# Patient Record
Sex: Female | Born: 1955
Health system: Southern US, Community
[De-identification: ages and names within clinical notes are randomized; demographics above are authoritative.]

## PROBLEM LIST (undated history)

## (undated) DIAGNOSIS — M1990 Unspecified osteoarthritis, unspecified site: Secondary | ICD-10-CM

## (undated) DIAGNOSIS — R51 Headache: Secondary | ICD-10-CM

## (undated) DIAGNOSIS — I10 Essential (primary) hypertension: Secondary | ICD-10-CM

## (undated) HISTORY — PX: ROTATOR CUFF REPAIR: SHX139

## (undated) HISTORY — DX: Essential (primary) hypertension: I10

## (undated) HISTORY — DX: Headache: R51

## (undated) HISTORY — DX: Unspecified osteoarthritis, unspecified site: M19.90

---

## 1995-03-30 HISTORY — PX: APPENDECTOMY: SHX54

## 2006-03-29 LAB — HM PAP SMEAR: HM Pap smear: NORMAL

## 2006-03-29 LAB — HM MAMMOGRAPHY: HM Mammogram: NORMAL

## 2010-07-19 ENCOUNTER — Emergency Department (INDEPENDENT_AMBULATORY_CARE_PROVIDER_SITE_OTHER): Payer: BC Managed Care – PPO

## 2010-07-19 ENCOUNTER — Emergency Department (HOSPITAL_BASED_OUTPATIENT_CLINIC_OR_DEPARTMENT_OTHER)
Admission: EM | Admit: 2010-07-19 | Discharge: 2010-07-19 | Disposition: A | Discharge status: Home / Self Care | Payer: BC Managed Care – PPO | Attending: Emergency Medicine | Admitting: Emergency Medicine

## 2010-07-19 DIAGNOSIS — R1110 Vomiting, unspecified: Secondary | ICD-10-CM

## 2010-07-19 DIAGNOSIS — R599 Enlarged lymph nodes, unspecified: Secondary | ICD-10-CM | POA: Insufficient documentation

## 2010-07-19 DIAGNOSIS — R109 Unspecified abdominal pain: Secondary | ICD-10-CM | POA: Insufficient documentation

## 2010-07-19 DIAGNOSIS — K8020 Calculus of gallbladder without cholecystitis without obstruction: Secondary | ICD-10-CM | POA: Insufficient documentation

## 2010-07-19 LAB — COMPREHENSIVE METABOLIC PANEL
AST: 48 U/L — ABNORMAL HIGH (ref 0–37)
Albumin: 4 g/dL (ref 3.5–5.2)
BUN: 13 mg/dL (ref 6–23)
Calcium: 9.4 mg/dL (ref 8.4–10.5)
Chloride: 103 mEq/L (ref 96–112)
Creatinine, Ser: 0.8 mg/dL (ref 0.4–1.2)
GFR calc Af Amer: >60 mL/min (ref >60)
Total Bilirubin: 0.4 mg/dL (ref 0.3–1.2)
Total Protein: 7.9 g/dL (ref 6.0–8.3)

## 2010-07-19 LAB — DIFFERENTIAL
Basophils Relative: 0 % (ref 0–1)
Eosinophils Absolute: 0.1 10*3/uL (ref 0.0–0.7)
Eosinophils Relative: 1 % (ref 0–5)
Lymphs Abs: 1.1 10*3/uL (ref 0.7–4.0)
Monocytes Relative: 10 % (ref 3–12)
Neutrophils Relative %: 72 % (ref 43–77)

## 2010-07-19 LAB — CBC
MCH: 27.8 pg (ref 26.0–34.0)
MCHC: 34.6 g/dL (ref 30.0–36.0)
MCV: 80.3 fL (ref 78.0–100.0)
Platelets: 266 10*3/uL (ref 150–400)
RBC: 4.57 MIL/uL (ref 3.87–5.11)
RDW: 14.3 % (ref 11.5–15.5)

## 2010-07-19 LAB — URINALYSIS, ROUTINE W REFLEX MICROSCOPIC
Glucose, UA: NEGATIVE mg/dL
Ketones, ur: NEGATIVE mg/dL
Nitrite: NEGATIVE
Protein, ur: NEGATIVE mg/dL
pH: 6 (ref 5.0–8.0)

## 2010-07-19 LAB — URINE MICROSCOPIC-ADD ON

## 2010-07-19 MED ORDER — IOHEXOL 300 MG/ML  SOLN
100.0000 mL | Freq: Once | INTRAMUSCULAR | Status: AC | PRN
  Administered 2010-07-19: 100 mL via INTRAVENOUS

## 2010-07-20 ENCOUNTER — Encounter: Payer: Self-pay | Admitting: Family

## 2010-07-20 ENCOUNTER — Ambulatory Visit (INDEPENDENT_AMBULATORY_CARE_PROVIDER_SITE_OTHER): Payer: BC Managed Care – PPO | Admitting: Family

## 2010-07-20 DIAGNOSIS — R590 Localized enlarged lymph nodes: Secondary | ICD-10-CM | POA: Insufficient documentation

## 2010-07-20 DIAGNOSIS — R599 Enlarged lymph nodes, unspecified: Secondary | ICD-10-CM

## 2010-07-20 DIAGNOSIS — R1013 Epigastric pain: Secondary | ICD-10-CM

## 2010-07-20 NOTE — Assessment & Plan Note (Addendum)
55 yr old female with abdominal pain.  CT abdomen and pelvis notes pericholecystic fluid with gallstone- ? Cholecystitis- will refer to surgery for further evaluation. Pt was instructed to call if worsening abdominal pain, nausea, vomitting.  She verbalizes understanding.

## 2010-07-20 NOTE — Assessment & Plan Note (Signed)
Incidental finding on CT abdomen/pelvis.  Will refer to Dr. Arlan Organ for further evaluation.  Discussed this finding with Patient and her daughter- discussed possible etiologies for this finding including lymphoma or infection.  Case was reviewed with Dr. Artist Pais.

## 2010-07-20 NOTE — Patient Instructions (Signed)
You will be contacted about your referral to Dr. Arlan Organ- Hematology/Oncology. You will also be contacted about your referral to Surgery.

## 2010-07-20 NOTE — Progress Notes (Signed)
Subjective:    Patient ID: Shannon Wallace, female    DOB: 28-Jun-1955, 55 y.o.   MRN: [id number]  HPI  Ms.  Nichelson is a 55 yr old female who presents today to establish care.  She has not had regular medical care.  She was seen in the ED on 4/22 and was diagnosed with cholecystitis and lymphadenopathy.  She was referred to our practice for further evaluation.  1. Abdominal pain- She notes occasional nausea and vomitting x 2 days, this is associated with epigastric pain.     These symptoms are worse after meals.  + HA, but not fever.  Denies associated diarrhea or constipation.  She is a poorhistorian.  2. Headaches- rare.  3. Elevated blood pressure-Has never been treated. She notes that she has a + family history of HTN.      Review of Systems  Constitutional: Negative for fever, activity change, appetite change, fatigue and unexpected weight change.  HENT: Negative for hearing loss.   Eyes: Negative for visual disturbance.  Respiratory: Negative for chest tightness and shortness of breath.   Cardiovascular: Negative for chest pain and leg swelling.  Gastrointestinal: Positive for nausea and vomiting. Negative for diarrhea, constipation and blood in stool.  Genitourinary: Negative for dysuria and frequency.  Musculoskeletal: Negative for back pain and joint swelling.  Skin: Negative for rash.  Neurological: Negative for weakness and numbness.  Hematological: Negative for adenopathy.  Psychiatric/Behavioral:       Denies depression/anxiety   Past Medical History  Diagnosis Date  . Arthritis   . Hypertension   . Headache     frequent    History   Social History  . Marital Status: Single    Spouse Name: N/A    Number of Children: 3  . Years of Education: N/A   Occupational History  .  [employer]   Social History Main Topics  . Smoking status: Never Smoker   . Smokeless tobacco: Never Used  . Alcohol Use: No  . Drug Use: No  . Sexually Active: Not on file     Other Topics Concern  . Not on file   Social History Narrative   Caffeine Use: 3-4 dailyRegular exercise:  NoDivorced, lives with [place].Works in [employer]    Past Surgical History  Procedure Date  . Appendectomy 1997  . Cesarean section     Family History  Problem Relation Age of Onset  . Arthritis Mother   . Stroke Mother   . Hypertension Mother   . Diabetes Mother   . Arthritis Father   . Cancer Sister     hysterectomy due to abnormal pap smears    No Known Allergies  No current outpatient prescriptions on file prior to visit.   Current Facility-Administered Medications on File Prior to Visit  Medication Dose Route Frequency Provider Last Rate Last Dose  . iohexol (OMNIPAQUE) 300 MG/ML injection 100 mL  100 mL Intravenous Once PRN Medication Radiologist   100 mL at 07/19/10 2138    BP 140/70  Pulse 60  Temp(Src) 98.4 F (36.9 C) (Oral)  Resp 16  Ht 5' 1.5" (1.562 m)  Wt 181 lb 1.3 oz (82.137 kg)  BMI 33.66 kg/m2       Objective:   Physical Exam  Constitutional: She appears well-developed and well-nourished.  HENT:  Head: Normocephalic and atraumatic.  Eyes: Pupils are equal, round, and reactive to light.  Neck: Normal range of motion. Neck supple. No thyromegaly present.  Cardiovascular: Normal rate and regular rhythm.  Exam reveals no gallop and no friction rub.   No murmur heard. Pulmonary/Chest: Effort normal. No respiratory distress. She has no wheezes. She has no rales.  Abdominal: Soft. Bowel sounds are normal. She exhibits no distension and no mass. There is no guarding.       + epigastric/RUQ tenderness to palpation without guarding, or rebound tenderness.   Lymphadenopathy:    She has no cervical adenopathy.          Assessment & Plan:

## 2010-07-21 ENCOUNTER — Ambulatory Visit: Payer: BC Managed Care – PPO | Admitting: Hematology & Oncology

## 2010-07-21 ENCOUNTER — Telehealth: Payer: Self-pay | Admitting: Family

## 2010-07-21 DIAGNOSIS — R599 Enlarged lymph nodes, unspecified: Secondary | ICD-10-CM

## 2010-07-21 LAB — URINE CULTURE
Colony Count: 50000
Culture  Setup Time: 201204230001

## 2010-07-21 NOTE — Telephone Encounter (Signed)
Left message for pt to return our call.  When she calls back pls let her know that Dr. Myna Hidalgo has requested a CT guided biopsy of the enlarged lymph nodes in her abdomen before he will see her.  This will be completed by Interventional radiology.  I have made this referral.  I still will refer her to the general surgeon for evaluation of her gallbladder.

## 2010-07-21 NOTE — Telephone Encounter (Signed)
Pt returned phone call. Pt states that you were calling her regarding appt dates and times?

## 2010-07-21 NOTE — Progress Notes (Signed)
Addended by: Sandford Craze on: 07/21/2010 01:25 PM   Modules accepted: Orders, SmartSet

## 2010-07-21 NOTE — Telephone Encounter (Signed)
Pt notified and voices understanding. She will wait to be contacted re: appt dates/times.

## 2010-07-22 NOTE — Telephone Encounter (Signed)
Spoke with pt and she states that she spoke to [facility] yesterday and is aware of referral status.

## 2010-07-24 ENCOUNTER — Other Ambulatory Visit: Payer: Self-pay | Admitting: Hematology & Oncology

## 2010-07-24 DIAGNOSIS — R599 Enlarged lymph nodes, unspecified: Secondary | ICD-10-CM

## 2010-07-28 ENCOUNTER — Telehealth: Payer: Self-pay | Admitting: *Deleted

## 2010-07-28 ENCOUNTER — Telehealth: Payer: Self-pay | Admitting: Family

## 2010-07-28 NOTE — Telephone Encounter (Signed)
Pt left message with staff requesting that we cancel her CT guided biopsy.  I have left message for patient to return my call.

## 2010-07-28 NOTE — Telephone Encounter (Signed)
Pt called me back and tells me that she had gallbladder surgery performed "at a hospital in [place]."  She was unable to provide me with any further information. She said that her daughter has been handling everything and I should talk with her.  I left message on daughter's cell phone requesting call back.  Our records show that pt's apt with [facility] [place] [facility] was scheduled for later this week.

## 2010-07-28 NOTE — Telephone Encounter (Signed)
Received voice message from Nash Dimmer at [facility] stating they have pt scheduled for this Friday for a CT guided biopsy. She has left message for pt but pt hasn't returned her call. Spoke with pt and provided her with [facility] phone number [phone]). She states she will call them back today.

## 2010-07-29 NOTE — Telephone Encounter (Signed)
Left message on daughter Kathy's cell number for her to return my call.

## 2010-07-29 NOTE — Telephone Encounter (Signed)
Shannon Wallace returned my call.  She reports that her mom had her gallbladder removed by "Dr. Katrinka Blazing" in [place].  She tells me that he has her scheduled for a CT guided biopsy.  She asks that we cancel the apts that we have scheduled (sugery and CT guided biopsy) and tells me that they are going to establish with another primary care because- "it takes too long for you guys to set up appointments."  I explained to the daughter that her mother may need referral to oncology pending the biopsy results and she says that she will let Dr. Katrinka Blazing take care of that referral.

## 2010-07-31 ENCOUNTER — Other Ambulatory Visit (HOSPITAL_COMMUNITY): Payer: BC Managed Care – PPO

## 2010-08-17 ENCOUNTER — Ambulatory Visit: Payer: BC Managed Care – PPO | Admitting: Family

## 2013-02-08 ENCOUNTER — Emergency Department (HOSPITAL_BASED_OUTPATIENT_CLINIC_OR_DEPARTMENT_OTHER)
Admission: EM | Admit: 2013-02-08 | Discharge: 2013-02-09 | Disposition: A | Discharge status: Home / Self Care | Payer: BC Managed Care – PPO | Attending: Emergency Medicine | Admitting: Emergency Medicine

## 2013-02-08 ENCOUNTER — Encounter (HOSPITAL_BASED_OUTPATIENT_CLINIC_OR_DEPARTMENT_OTHER): Payer: Self-pay | Admitting: Emergency Medicine

## 2013-02-08 DIAGNOSIS — I10 Essential (primary) hypertension: Secondary | ICD-10-CM | POA: Insufficient documentation

## 2013-02-08 DIAGNOSIS — R1110 Vomiting, unspecified: Secondary | ICD-10-CM | POA: Insufficient documentation

## 2013-02-08 DIAGNOSIS — R197 Diarrhea, unspecified: Secondary | ICD-10-CM | POA: Insufficient documentation

## 2013-02-08 DIAGNOSIS — R42 Dizziness and giddiness: Secondary | ICD-10-CM | POA: Insufficient documentation

## 2013-02-08 DIAGNOSIS — M129 Arthropathy, unspecified: Secondary | ICD-10-CM | POA: Insufficient documentation

## 2013-02-08 DIAGNOSIS — R52 Pain, unspecified: Secondary | ICD-10-CM | POA: Insufficient documentation

## 2013-02-08 MED ORDER — ONDANSETRON 8 MG PO TBDP
ORAL_TABLET | ORAL | Status: AC
  Administered 2013-02-08: 8 mg via ORAL
  Filled 2013-02-08: qty 1

## 2013-02-08 MED ORDER — ONDANSETRON 8 MG PO TBDP
8.0000 mg | ORAL_TABLET | Freq: Once | ORAL | Status: AC
  Administered 2013-02-08: 8 mg via ORAL

## 2013-02-08 NOTE — ED Provider Notes (Signed)
CSN: [id number]     Arrival date & time 02/08/13  2133 History  This chart was scribed for Goldia Ligman Smitty Cords, MD by Ronal Fear, ED Scribe. This patient was seen in room MH01/MH01 and the patient's care was started at 11:33 PM.    Chief Complaint  Patient presents with  . Emesis   (Consider location/radiation/quality/duration/timing/severity/associated sxs/prior Treatment) Patient is a 57 y.o. female presenting with vomiting. The history is provided by the patient. No language interpreter was used.  Emesis Severity:  Mild Duration:  1 day Timing:  Intermittent Number of daily episodes:  2 Quality:  Stomach contents Able to tolerate:  Liquids Progression:  Unchanged Chronicity:  New Relieved by:  Nothing Worsened by:  Nothing tried Ineffective treatments:  None tried Associated symptoms: diarrhea   Associated symptoms: no abdominal pain, no chills, no fever and no sore throat   Diarrhea:    Quality:  Watery   Number of occurrences:  4   Severity:  Mild   Timing:  Sporadic   Progression:  Improving Risk factors: no suspect food intake     HPI Comments: ASHLON LOTTMAN is a 57 y.o. female who presents to the Emergency Department complaining of sudden onset generalized body aches, 4x episodes of diarrhea, 2x episodes of vomiting in the past 24 hours, and light headedness since 1x day ago. Pt may have been exposed to another sick party at work. Pt has been exposed to a sick grand daughter at home. Pt denies fever, cough.  Past Medical History  Diagnosis Date  . Arthritis   . Hypertension   . Headache(784.0)     frequent   Past Surgical History  Procedure Laterality Date  . Appendectomy  1997  . Cesarean section     Family History  Problem Relation Age of Onset  . Arthritis Mother   . Stroke Mother   . Hypertension Mother   . Diabetes Mother   . Arthritis Father   . Cancer Sister     hysterectomy due to abnormal pap smears   History  Substance Use Topics  .  Smoking status: Never Smoker   . Smokeless tobacco: Never Used  . Alcohol Use: No   OB History   Grav Para Term Preterm Abortions TAB SAB Ect Mult Living                 Review of Systems  Constitutional: Negative for fever and chills.  HENT: Negative for congestion, rhinorrhea and sore throat.   Respiratory: Negative for cough and shortness of breath.   Gastrointestinal: Positive for nausea, vomiting and diarrhea. Negative for abdominal pain.  Genitourinary: Negative for dysuria and hematuria.  Neurological: Positive for light-headedness.  All other systems reviewed and are negative.    Allergies  Review of patient's allergies indicates no known allergies.  Home Medications   Current Outpatient Rx  Name  Route  Sig  Dispense  Refill  . HYDROcodone-acetaminophen (NORCO) 5-325 MG per tablet   Oral   Take 1 tablet by mouth. Every 4-6 hours as needed for pain.          Marland Kitchen ondansetron (ZOFRAN) 8 MG tablet   Oral   Take 8 mg by mouth. Every 4 -6 hours as needed.           BP 128/75  Pulse 82  Temp(Src) 98.6 F (37 C) (Oral)  Resp 16  Ht 5\' 1"  (1.549 m)  Wt 160 lb (72.576 kg)  BMI 30.25 kg/m2  SpO2 99% Physical Exam  Nursing note and vitals reviewed. Constitutional: She is oriented to person, place, and time. She appears well-developed and well-nourished.  HENT:  Head: Normocephalic and atraumatic.  Mouth/Throat: Oropharynx is clear and moist.  Eyes: Pupils are equal, round, and reactive to light.  Neck: Neck supple.  Cardiovascular: Normal rate, regular rhythm and normal heart sounds.   Distal pulses intact  Pulmonary/Chest: Effort normal and breath sounds normal. No respiratory distress. She has no wheezes.  Abdominal: Soft. There is no tenderness. There is no rebound and no guarding.  Hyper active bowel sounds  Musculoskeletal: Normal range of motion. She exhibits no edema.  Neurological: She is alert and oriented to person, place, and time.  Skin: Skin is  warm and dry.  Psychiatric: She has a normal mood and affect.    ED Course  Procedures (including critical care time) DIAGNOSTIC STUDIES: Oxygen Saturation is 99% on RA, normal by my interpretation.    COORDINATION OF CARE:    11:37 PM- Pt advised of plan for treatment including anti emetics and abx and pt agrees.  Labs Review Labs Reviewed - No data to display Imaging Review No results found.  EKG Interpretation   None       MDM  No diagnosis found. Sick contacts and symptoms consistent with viral n/v/d.  No indication for labs or imaging.  Better with zofran.  Po challenged successfully.  Will prescribe bland diet.  Return for any concerning symptoms  I personally performed the services described in this documentation, which was scribed in my presence. The recorded information has been reviewed and is accurate.     Jasmine Awe, MD 02/09/13 [phone]

## 2013-02-08 NOTE — ED Notes (Addendum)
N/v/d since 11pm yesterday-generalized body aches 7/10-steady gait to triage-NAD

## 2013-02-09 MED ORDER — ONDANSETRON HCL 8 MG PO TABS: 8.0000 mg | ORAL_TABLET | Freq: Three times a day (TID) | ORAL | Status: AC | PRN

## 2013-02-09 NOTE — ED Notes (Signed)
Tolerated PO gingerale. No emesis

## 2013-02-09 NOTE — ED Notes (Signed)
rx x 1 given for zofran- note for work given

## 2013-09-22 ENCOUNTER — Emergency Department (HOSPITAL_BASED_OUTPATIENT_CLINIC_OR_DEPARTMENT_OTHER): Payer: BC Managed Care – PPO

## 2013-09-22 ENCOUNTER — Emergency Department (HOSPITAL_BASED_OUTPATIENT_CLINIC_OR_DEPARTMENT_OTHER)
Admission: EM | Admit: 2013-09-22 | Discharge: 2013-09-23 | Disposition: A | Discharge status: Home / Self Care | Payer: BC Managed Care – PPO | Attending: Emergency Medicine | Admitting: Emergency Medicine

## 2013-09-22 ENCOUNTER — Encounter (HOSPITAL_BASED_OUTPATIENT_CLINIC_OR_DEPARTMENT_OTHER): Payer: Self-pay | Admitting: Emergency Medicine

## 2013-09-22 DIAGNOSIS — S1093XA Contusion of unspecified part of neck, initial encounter: Principal | ICD-10-CM

## 2013-09-22 DIAGNOSIS — S0003XA Contusion of scalp, initial encounter: Secondary | ICD-10-CM | POA: Insufficient documentation

## 2013-09-22 DIAGNOSIS — Z79899 Other long term (current) drug therapy: Secondary | ICD-10-CM | POA: Insufficient documentation

## 2013-09-22 DIAGNOSIS — S0083XA Contusion of other part of head, initial encounter: Principal | ICD-10-CM | POA: Insufficient documentation

## 2013-09-22 DIAGNOSIS — W208XXA Other cause of strike by thrown, projected or falling object, initial encounter: Secondary | ICD-10-CM | POA: Insufficient documentation

## 2013-09-22 DIAGNOSIS — M129 Arthropathy, unspecified: Secondary | ICD-10-CM | POA: Insufficient documentation

## 2013-09-22 DIAGNOSIS — I10 Essential (primary) hypertension: Secondary | ICD-10-CM | POA: Insufficient documentation

## 2013-09-22 DIAGNOSIS — Y9389 Activity, other specified: Secondary | ICD-10-CM | POA: Insufficient documentation

## 2013-09-22 DIAGNOSIS — T148XXA Other injury of unspecified body region, initial encounter: Secondary | ICD-10-CM

## 2013-09-22 DIAGNOSIS — Y929 Unspecified place or not applicable: Secondary | ICD-10-CM | POA: Insufficient documentation

## 2013-09-22 MED ORDER — METHOCARBAMOL 500 MG PO TABS
1000.0000 mg | ORAL_TABLET | Freq: Once | ORAL | Status: AC
  Administered 2013-09-23: 1000 mg via ORAL
  Filled 2013-09-22: qty 2

## 2013-09-22 MED ORDER — OXYCODONE-ACETAMINOPHEN 5-325 MG PO TABS
1.0000 | ORAL_TABLET | Freq: Once | ORAL | Status: AC
  Administered 2013-09-23: 1 via ORAL
  Filled 2013-09-22: qty 1

## 2013-09-22 NOTE — ED Notes (Signed)
Pt in xray[facility]

## 2013-09-22 NOTE — ED Notes (Signed)
Pt back from xray, alert, NAD, calm, interactive, "feel OK", admits to hip pain is worse than head and neck. (denies: sobm, nausea or dizziness). Updated. [facility]
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
## 2013-09-23 ENCOUNTER — Encounter (HOSPITAL_BASED_OUTPATIENT_CLINIC_OR_DEPARTMENT_OTHER): Payer: Self-pay | Admitting: Emergency Medicine

## 2013-09-23 MED ORDER — HYDROCODONE-ACETAMINOPHEN 5-325 MG PO TABS: 1.0000 | ORAL_TABLET | Freq: Four times a day (QID) | ORAL | Status: AC | PRN

## 2013-09-23 NOTE — ED Notes (Signed)
Dr. Nicanor AlconPalumbo in to see/speak with pt & family.

## 2015-09-11 ENCOUNTER — Emergency Department (HOSPITAL_BASED_OUTPATIENT_CLINIC_OR_DEPARTMENT_OTHER)
Admission: EM | Admit: 2015-09-11 | Discharge: 2015-09-11 | Disposition: A | Discharge status: Home / Self Care | Payer: Self-pay | Attending: Emergency Medicine | Admitting: Emergency Medicine

## 2015-09-11 ENCOUNTER — Encounter (HOSPITAL_BASED_OUTPATIENT_CLINIC_OR_DEPARTMENT_OTHER): Payer: Self-pay | Admitting: *Deleted

## 2015-09-11 ENCOUNTER — Emergency Department (HOSPITAL_BASED_OUTPATIENT_CLINIC_OR_DEPARTMENT_OTHER): Payer: Self-pay

## 2015-09-11 DIAGNOSIS — M25471 Effusion, right ankle: Secondary | ICD-10-CM

## 2015-09-11 DIAGNOSIS — M1990 Unspecified osteoarthritis, unspecified site: Secondary | ICD-10-CM | POA: Insufficient documentation

## 2015-09-11 DIAGNOSIS — I10 Essential (primary) hypertension: Secondary | ICD-10-CM | POA: Insufficient documentation

## 2015-09-11 MED ORDER — IBUPROFEN 400 MG PO TABS
600.0000 mg | ORAL_TABLET | Freq: Once | ORAL | Status: AC
  Administered 2015-09-11: 600 mg via ORAL
  Filled 2015-09-11: qty 1

## 2015-09-11 NOTE — ED Notes (Signed)
Swelling in her right ankle x 2 weeks. No injury.

## 2015-09-11 NOTE — ED Provider Notes (Signed)
CSN: [id number]     Arrival date & time 09/11/15  4098 History  By signing my name below, I, Shannon Wallace, attest that this documentation has been prepared under the direction and in the presence of Pricilla Loveless, MD . Electronically Signed: Levon Wallace, Scribe. 09/11/2015. 9:27 PM.   Chief Complaint  Patient presents with  . Leg Swelling    The history is provided by the patient. No language interpreter was used.    HPI Comments: Shannon Wallace is a 60 y.o. female with PMHx of HTN who presents to the Emergency Department complaining of sudden onset, intermittent, gradual worsening, right ankle swelling x2 weeks. She also complains of associated aching in the ankle. Pt states swelling comes and goes and is worse when she rests it. Pt states she works on her feet and elevates her foot at night after work. Pt states she is unable to walk on ankle and has to drag ankle to ambulate due to pain. Pt has not taken any OTC medicine for pain. She denies any injury, recent prolonged travel, or recent surgery. She has no PMHx of blood clots. Pt denies swelling or pain in calf, weakness, numbness, chest pain, or shortness of breath.   Past Medical History  Diagnosis Date  . Arthritis   . Hypertension   . Headache(784.0)     frequent   Past Surgical History  Procedure Laterality Date  . Appendectomy  1997  . Cesarean section     Family History  Problem Relation Age of Onset  . Arthritis Mother   . Stroke Mother   . Hypertension Mother   . Diabetes Mother   . Arthritis Father   . Cancer Sister     hysterectomy due to abnormal pap smears   Social History  Substance Use Topics  . Smoking status: Never Smoker   . Smokeless tobacco: Never Used  . Alcohol Use: No   OB History    No data available     Review of Systems  Respiratory: Negative for shortness of breath.   Cardiovascular: Negative for chest pain and leg swelling.  Musculoskeletal: Positive for myalgias, joint swelling  and arthralgias.  Neurological: Negative for weakness and numbness.  All other systems reviewed and are negative.   Allergies  Tuberculin tests  Home Medications   Prior to Admission medications   Medication Sig Start Date End Date Taking? Authorizing Provider  HYDROcodone-acetaminophen (NORCO) 5-325 MG per tablet Take 1 tablet by mouth. Every 4-6 hours as needed for pain.     Historical Provider, MD  HYDROcodone-acetaminophen (NORCO) 5-325 MG per tablet Take 1 tablet by mouth every 6 (six) hours as needed. 09/23/13   April Palumbo, MD  ondansetron (ZOFRAN) 8 MG tablet Take 8 mg by mouth. Every 4 -6 hours as needed.     Historical Provider, MD  ondansetron (ZOFRAN) 8 MG tablet Take 1 tablet (8 mg total) by mouth every 8 (eight) hours as needed for nausea. 02/09/13   April Palumbo, MD   BP 173/72 mmHg  Pulse 83  Temp(Src) 98.1 F (36.7 C) (Oral)  Resp 20  Ht  (1.549 m)  Wt 165 lb (74.844 kg)  BMI 31.19 kg/m2  SpO2 100% Physical Exam  Constitutional: She is oriented to person, place, and time. She appears well-developed and well-nourished.  HENT:  Head: Normocephalic and atraumatic.  Right Ear: External ear normal.  Left Ear: External ear normal.  Nose: Nose normal.  Eyes: Right eye exhibits no discharge. Left  eye exhibits no discharge.  Cardiovascular: Normal rate.   Pulses:      Dorsalis pedis pulses are 2+ on the right side.  Pulmonary/Chest: Effort normal.  Abdominal: Soft. There is no tenderness.  Musculoskeletal:       Right ankle: She exhibits swelling. She exhibits normal range of motion. Tenderness (anterior). Achilles tendon exhibits no pain and no defect.       Right lower leg: She exhibits no tenderness and no swelling.       Right foot: There is tenderness (proximal) and swelling.  Neurological: She is alert and oriented to person, place, and time.  Skin: Skin is warm and dry.  Nursing note and vitals reviewed.   ED Course  Procedures  DIAGNOSTIC  STUDIES:  Oxygen Saturation is 100% on RA, normal by my interpretation.    COORDINATION OF CARE:  8:14 PM Discussed treatment plan which include X-ray and Ibuprofen with pt at bedside and pt agreed to plan.  Labs Review Labs Reviewed - No data to display  Imaging Review Dg Ankle Complete Right  09/11/2015  CLINICAL DATA:  Right foot and ankle pain and swelling for 2 weeks. EXAM: RIGHT ANKLE - COMPLETE 3+ VIEW COMPARISON:  None. FINDINGS: No evidence of acute fracture or dislocation in the right ankle. Ankle mortise and talar dome appear intact. Small soft tissue calcification lateral and posterior to the right distal fibula, likely dystrophic or possibly a phlebolith. Soft tissues are otherwise unremarkable. No focal bone lesion or bone destruction. Small Achilles spur of the calcaneus. IMPRESSION: No acute bony abnormalities. Electronically Signed   By: Burman NievesWilliam  Stevens M.D.   On: 09/11/2015 21:18   Dg Foot Complete Right  09/11/2015  CLINICAL DATA:  Right foot and ankle pain and swelling for 2 weeks. EXAM: RIGHT FOOT COMPLETE - 3+ VIEW COMPARISON:  None. FINDINGS: There is no evidence of fracture or dislocation. There is no evidence of arthropathy or other focal bone abnormality. Soft tissues are unremarkable. IMPRESSION: Negative. Electronically Signed   By: Burman NievesWilliam  Stevens M.D.   On: 09/11/2015 21:17   I have personally reviewed and evaluated these images and lab results as part of my medical decision-making.   EKG Interpretation None      MDM   Final diagnoses:  Right ankle swelling    Unclear why patient has right ankle/proximal foot swelling. No calf pain or swelling. Doubt DVT given it is isolated to foot/ankle. No warmth, heat, or redness to suggest infection. No joint effusion. Will recommend nsaids, elevation, ice and wrapping. F/u with PCP. Discussed return precautions.   I personally performed the services described in this documentation, which was scribed in my  presence. The recorded information has been reviewed and is accurate.     Pricilla LovelessScott Legaci Tarman, MD 09/12/15 [phone]

## 2016-05-28 ENCOUNTER — Encounter (HOSPITAL_BASED_OUTPATIENT_CLINIC_OR_DEPARTMENT_OTHER): Payer: Self-pay | Admitting: Emergency Medicine

## 2016-05-28 ENCOUNTER — Emergency Department (HOSPITAL_BASED_OUTPATIENT_CLINIC_OR_DEPARTMENT_OTHER): Payer: BLUE CROSS/BLUE SHIELD

## 2016-05-28 ENCOUNTER — Emergency Department (HOSPITAL_BASED_OUTPATIENT_CLINIC_OR_DEPARTMENT_OTHER)
Admission: EM | Admit: 2016-05-28 | Discharge: 2016-05-28 | Disposition: A | Discharge status: Home / Self Care | Payer: BLUE CROSS/BLUE SHIELD | Attending: Emergency Medicine | Admitting: Emergency Medicine

## 2016-05-28 DIAGNOSIS — I10 Essential (primary) hypertension: Secondary | ICD-10-CM | POA: Insufficient documentation

## 2016-05-28 DIAGNOSIS — R059 Cough, unspecified: Secondary | ICD-10-CM

## 2016-05-28 DIAGNOSIS — J181 Lobar pneumonia, unspecified organism: Secondary | ICD-10-CM | POA: Diagnosis not present

## 2016-05-28 DIAGNOSIS — R05 Cough: Secondary | ICD-10-CM

## 2016-05-28 DIAGNOSIS — J189 Pneumonia, unspecified organism: Secondary | ICD-10-CM

## 2016-05-28 MED ORDER — AZITHROMYCIN 250 MG PO TABS: ORAL_TABLET | ORAL | 0 refills | Status: AC

## 2016-05-28 MED FILL — AZITHROMYCIN 250 MG TABLET: 250 | 5 days supply | Qty: 6 | Fill #0

## 2016-05-28 NOTE — ED Provider Notes (Signed)
[facility] Provider [facility]   CSN: [id number] Arrival date & time: 05/28/16  [id number]     History   Chief Complaint Chief Complaint  Patient presents with  . Cough    HPI Shannon Wallace is a 61 y.o. female.  Patient c/o onset cough x 1 week. Cough is episodic, occasionally produces small amt phlegm. Denies sob. No chest pain. No sore throat or runny nose. No headaches or body aches. No known ill contacts. No hx chronic lung diseas/asthma. Non smoker.    The history is provided by the patient.  Cough  Pertinent negatives include no chest pain, no headaches and no sore throat.    Past Medical History:  Diagnosis Date  . Arthritis   . Headache(784.0)    frequent  . Hypertension     Patient Active Problem List   Diagnosis Date Noted  . Abdominal pain, epigastric 07/20/2010  . Retroperitoneal lymphadenopathy 07/20/2010    Past Surgical History:  Procedure Laterality Date  . APPENDECTOMY  1997  . CESAREAN SECTION      OB History    No data available       Home Medications    Prior to Admission medications   Medication Sig Start Date End Date Taking? Authorizing Provider  Vitamin D, Ergocalciferol, (DRISDOL) 50000 units CAPS capsule Take 50,000 Units by mouth every 7 (seven) days.   Yes Historical Provider, MD  HYDROcodone-acetaminophen (NORCO) 5-325 MG per tablet Take 1 tablet by mouth. Every 4-6 hours as needed for pain.     Historical Provider, MD  HYDROcodone-acetaminophen (NORCO) 5-325 MG per tablet Take 1 tablet by mouth every 6 (six) hours as needed. 09/23/13   April Palumbo, MD  ondansetron (ZOFRAN) 8 MG tablet Take 8 mg by mouth. Every 4 -6 hours as needed.     Historical Provider, MD  ondansetron (ZOFRAN) 8 MG tablet Take 1 tablet (8 mg total) by mouth every 8 (eight) hours as needed for nausea. 02/09/13   April Palumbo, MD    Family History Family History  Problem Relation Age of Onset  . Arthritis Mother   . Stroke Mother   .  Hypertension Mother   . Diabetes Mother   . Arthritis Father   . Cancer Sister     hysterectomy due to abnormal pap smears    Social History Social History  Substance Use Topics  . Smoking status: Never Smoker  . Smokeless tobacco: Never Used  . Alcohol use No     Allergies   Tuberculin tests   Review of Systems Review of Systems  Constitutional: Negative for fever.  HENT: Negative for sore throat.   Respiratory: Positive for cough.   Cardiovascular: Negative for chest pain and leg swelling.  Gastrointestinal: Negative for vomiting.  Skin: Negative for rash.  Neurological: Negative for headaches.     Physical Exam Updated Vital Signs BP 132/79 (BP Location: Right Arm)   Pulse 86   Temp 98.5 F (36.9 C) (Oral)   Resp 16   Ht 5\' 1"  (1.549 m)   Wt 72.6 kg   SpO2 97%   BMI 30.23 kg/m   Physical Exam  Constitutional: She appears well-developed and well-nourished. No distress.  HENT:  Right Ear: External ear normal.  Left Ear: External ear normal.  Mouth/Throat: Oropharynx is clear and moist.  Eyes: Conjunctivae are normal. No scleral icterus.  Neck: Neck supple. No tracheal deviation present.  Cardiovascular: Normal rate, regular rhythm, normal heart sounds and intact distal pulses.  Exam reveals no gallop and no friction rub.   No murmur heard. Pulmonary/Chest: Effort normal. No respiratory distress.  Mild resp congestion, right.   Abdominal: Soft. Normal appearance and bowel sounds are normal. She exhibits no distension. There is no tenderness.  Musculoskeletal: She exhibits no edema.  Neurological: She is alert.  Skin: Skin is warm and dry. No rash noted. She is not diaphoretic.  Psychiatric: She has a normal mood and affect.  Nursing note and vitals reviewed.    ED Treatments / Results  Labs (all labs ordered are listed, but only abnormal results are displayed) Labs Reviewed - No data to display  EKG  EKG Interpretation None        Radiology Dg Chest 2 View  Result Date: 05/28/2016 CLINICAL DATA:  Dry cough for 1 week, hypertension EXAM: CHEST  2 VIEW COMPARISON:  Non FINDINGS: Normal heart size, mediastinal contours, and pulmonary vascularity. Questionable RIGHT upper lobe perihilar infiltrate versus suprahilar nodular density. Remaining lungs clear. No pleural effusion or pneumothorax. Bones demineralized. IMPRESSION: Questionable RIGHT suprahilar density which could represent infiltrate or nodule. Either serial radiographic followup until resolution or CT chest recommended to definitively exclude pulmonary nodule. Electronically Signed   By: Ulyses Southward M.D.   On: 05/28/2016 09:46    Procedures Procedures (including critical care time)  Medications Ordered in ED Medications - No data to display   Initial Impression / Assessment and Plan / ED Course  I have reviewed the triage vital signs and the nursing notes.  Pertinent labs & imaging results that were available during my care of the patient were reviewed by me and considered in my medical decision making (see chart for details).  Cxr.  Reviewed nursing notes and prior charts for additional history.   ?infiltrate on cxr.  Will rx doxycycline.  Discussed need for f/u cxr as outpt.      Final Clinical Impressions(s) / ED Diagnoses   Final diagnoses:  None    New Prescriptions New Prescriptions   No medications on file     Cathren Laine, MD 05/28/16 [phone]

## 2016-05-28 NOTE — Discharge Instructions (Signed)
It was our pleasure to provide your ER care today - we hope that you feel better.  Take antibiotic as prescribed.  You may take over the counter cough medication such as mucinex or robitussin as need.  Follow up with primary care doctor in 1 week if symptoms fail to improve/resolve.  Your chest xray was read as showing: Questionable RIGHT suprahilar density which could represent infiltrate or nodule.  Either serial radiographic followup until resolution or CT chest recommended to definitively exclude pulmonary nodule.  Discuss above chest xray with primary care doctor, and have them repeat an imaging study in the next 1-2 months to make sure your xray returns to normal.   Return to ER if worse, trouble breathing, other concern.

## 2016-05-28 NOTE — ED Notes (Signed)
ED Provider at bedside. 

## 2016-05-28 NOTE — ED Triage Notes (Addendum)
Patient reports dry cough x 1 week.  States she has been taking OTC medicines without relief.  Prescribed tessalon via e-visit-no relief.

## 2018-06-08 ENCOUNTER — Encounter (HOSPITAL_BASED_OUTPATIENT_CLINIC_OR_DEPARTMENT_OTHER): Payer: Self-pay

## 2018-06-08 ENCOUNTER — Emergency Department (HOSPITAL_BASED_OUTPATIENT_CLINIC_OR_DEPARTMENT_OTHER): Payer: BLUE CROSS/BLUE SHIELD

## 2018-06-08 ENCOUNTER — Other Ambulatory Visit: Payer: Self-pay

## 2018-06-08 ENCOUNTER — Emergency Department (HOSPITAL_BASED_OUTPATIENT_CLINIC_OR_DEPARTMENT_OTHER)
Admission: EM | Admit: 2018-06-08 | Discharge: 2018-06-08 | Disposition: A | Discharge status: Home / Self Care | Payer: BLUE CROSS/BLUE SHIELD | Attending: Emergency Medicine | Admitting: Emergency Medicine

## 2018-06-08 DIAGNOSIS — M79601 Pain in right arm: Secondary | ICD-10-CM | POA: Diagnosis present

## 2018-06-08 DIAGNOSIS — I10 Essential (primary) hypertension: Secondary | ICD-10-CM | POA: Insufficient documentation

## 2018-06-08 MED ORDER — LIDOCAINE 5 % EX PTCH: 1.0000 | MEDICATED_PATCH | CUTANEOUS | 0 refills | Status: AC

## 2018-06-08 MED ORDER — NAPROXEN 500 MG PO TABS: 500.0000 mg | ORAL_TABLET | Freq: Two times a day (BID) | ORAL | 0 refills | Status: AC

## 2018-06-08 NOTE — ED Triage Notes (Addendum)
Pt c/o right shoulder pain started yesterday after working a certain job at work yesterday with repetitive arm motion-denies injury-pain worse with movement-NAD-steady gait

## 2018-06-08 NOTE — Discharge Instructions (Addendum)
Evaluated today for right arm pain.  Your x-ray showed age-related changes to right shoulder.  There is some concern for your tenderness and swelling to your right upper arm.  I have scheduled an outpatient ultrasound.  Please follow the directions on your discharge paperwork for your appointment time.  I have given you naproxen as well as lidocaine patches.  Once these lidocaine patches were placed he will need to remain for 12 hours.  You will need to remove these after 12 hours before he placed another one.  If your outpatient ultrasound is negative, I have referred you to orthopedics.  Please follow-up if you continue to have pain to this area.  Also suggest heat to the area.  Follow-up with PCP for reevaluation.

## 2018-06-08 NOTE — ED Provider Notes (Signed)
[facility] EMERGENCY DEPARTMENT Provider Note   CSN: [id number] Arrival date & time: 06/08/18  1633  History   Chief Complaint Chief Complaint  Patient presents with  . Shoulder Pain    HPI Shannon Wallace is a 63 y.o. female with past medical history significant for hypertension, arthritis who presents for evaluation of right arm pain.  Patient states she began having right arm pain yesterday.  States she does see repetitive motions at work.  Took 1 Aleve this morning.  States she does not have any current pain.  It is located to her mid humerus.  She has no pain at the shoulder.  Full range of motion without difficulty.  She denies fever, chills, nausea, vomiting, decreased range of motion, numbness or tingling, redness, warmth in her extremities.  Denies additional aggravating or alleviating factors.  Pain did not radiate.  Has not taken anything PTA. Pain at worse was a 6/10. Pain is intermittent in nature.  History obtained from patient.  No interpreter was used.      Arm Injury  Location:  Arm Arm location:  R upper arm Injury: no   Pain details:    Quality:  Aching   Radiates to:  Does not radiate   Severity:  No pain   Onset quality:  Gradual   Duration:  1 day   Timing:  Intermittent   Progression:  Waxing and waning Handedness:  Right-handed Dislocation: no   Foreign body present:  No foreign bodies Tetanus status:  Unknown Prior injury to area:  Unable to specify Relieved by:  NSAIDs Worsened by:  Movement and exercise Associated symptoms: swelling   Associated symptoms: no back pain, no decreased range of motion, no fatigue, no fever, no muscle weakness, no neck pain, no numbness, no stiffness and no tingling   Risk factors: no concern for non-accidental trauma, no known bone disorder, no frequent fractures and no recent illness     Past Medical History:  Diagnosis Date  . Arthritis   . Headache(784.0)    frequent  . Hypertension      Patient Active Problem List   Diagnosis Date Noted  . Abdominal pain, epigastric 07/20/2010  . Retroperitoneal lymphadenopathy 07/20/2010    Past Surgical History:  Procedure Laterality Date  . APPENDECTOMY  1997  . CESAREAN SECTION       OB History   No obstetric history on file.      Home Medications    Prior to Admission medications   Medication Sig Start Date End Date Taking? Authorizing Provider  azithromycin (ZITHROMAX Z-PAK) 250 MG tablet Take as directed, take 500 mg (2 tablets) today, then one (1) tablet a day for the next 4 days. 05/28/16   Cathren Laine, MD  HYDROcodone-acetaminophen (NORCO) 5-325 MG per tablet Take 1 tablet by mouth. Every 4-6 hours as needed for pain.     [provider]  HYDROcodone-acetaminophen (NORCO) 5-325 MG per tablet Take 1 tablet by mouth every 6 (six) hours as needed. 09/23/13   Palumbo, April, MD  lidocaine (LIDODERM) 5 % Place 1 patch onto the skin daily. Remove & Discard patch within 12 hours or as directed by MD 06/08/18   Henderly, Britni A, PA-C  naproxen (NAPROSYN) 500 MG tablet Take 1 tablet (500 mg total) by mouth 2 (two) times daily. 06/08/18   Henderly, Britni A, PA-C  ondansetron (ZOFRAN) 8 MG tablet Take 8 mg by mouth. Every 4 -6 hours as needed.  [provider]  ondansetron (ZOFRAN) 8 MG tablet Take 1 tablet (8 mg total) by mouth every 8 (eight) hours as needed for nausea. 02/09/13   Palumbo, April, MD  Vitamin D, Ergocalciferol, (DRISDOL) 50000 units CAPS capsule Take 50,000 Units by mouth every 7 (seven) days.    [provider]    Family History Family History  Problem Relation Age of Onset  . Arthritis Mother   . Stroke Mother   . Hypertension Mother   . Diabetes Mother   . Arthritis Father   . Cancer Sister        hysterectomy due to abnormal pap smears    Social History Social History   Tobacco Use  . Smoking status: Never Smoker  . Smokeless tobacco: Never Used  Substance Use  Topics  . Alcohol use: No  . Drug use: No     Allergies   Tuberculin tests   Review of Systems Review of Systems  Constitutional: Negative.  Negative for fatigue and fever.  HENT: Negative.   Respiratory: Negative.   Cardiovascular: Negative.   Gastrointestinal: Negative.   Genitourinary: Negative.   Musculoskeletal: Negative for back pain, neck pain and stiffness.       Right Arm pain.  Skin: Negative.   Neurological: Negative.   All other systems reviewed and are negative.    Physical Exam Updated Vital Signs BP (!) 165/76 (BP Location: Left Arm)   Pulse 82   Temp 97.8 F (36.6 C) (Oral)   Resp 14   Ht 5\' 1"  (1.549 m)   Wt 80.7 kg   SpO2 100%   BMI 33.63 kg/m   Physical Exam Vitals signs and nursing note reviewed.  Constitutional:      General: She is not in acute distress.    Appearance: She is well-developed. She is not ill-appearing, toxic-appearing or diaphoretic.  HENT:     Head: Atraumatic.     Nose: Nose normal.     Mouth/Throat:     Mouth: Mucous membranes are moist.     Pharynx: Oropharynx is clear.  Eyes:     Pupils: Pupils are equal, round, and reactive to light.  Neck:     Musculoskeletal: Normal range of motion and neck supple.  Cardiovascular:     Rate and Rhythm: Normal rate.     Pulses: Normal pulses.     Heart sounds: Normal heart sounds.  Pulmonary:     Effort: Pulmonary effort is normal. No respiratory distress.     Breath sounds: Normal breath sounds. No wheezing, rhonchi or rales.  Abdominal:     General: There is no distension.     Comments: Soft, nontender without rebound or guarding.  Musculoskeletal: Normal range of motion.     Comments: Full range of motion bilateral upper extremities with flexion, extension, abduction, abduction. Negative hawkins, empty can test to right upper extremity. No tenderness palpation to anterior, posterior or lateral right shoulder.  No trapezius tenderness.  No Tenderness to midline cervical  thoracic spine.  No obvious deformity.  Patient does have tenderness over lateral and anterior humerus with swelling.  Skin:    General: Skin is warm and dry.     Comments: Tenderness and swelling to anterior and lateral right upper extremity mid humerus. No erythema or warmth.  Neurological:     Mental Status: She is alert.    ED Treatments / Results  Labs (all labs ordered are listed, but only abnormal results are displayed) Labs Reviewed - No  data to display  EKG None  Radiology Dg Humerus Right  Result Date: 06/08/2018 CLINICAL DATA:  Right shoulder pain and humerus pain. EXAM: RIGHT HUMERUS - 2+ VIEW COMPARISON:  None. FINDINGS: Degenerative changes are noted in the acromioclavicular joint. No evidence for an acute fracture. No evidence for shoulder separation or dislocation. IMPRESSION: AC joint degenerative changes without acute bony abnormality. Electronically Signed   By: Kennith Center M.D.   On: 06/08/2018 18:07    Procedures Procedures (including critical care time)  Medications Ordered in ED Medications - No data to display   Initial Impression / Assessment and Plan / ED Course  I have reviewed the triage vital signs and the nursing notes.  Pertinent labs & imaging results that were available during my care of the patient were reviewed by me and considered in my medical decision making (see chart for details).  63 year old female appears otherwise well presents for evaluation of right shoulder pain.  Afebrile, nonseptic, non-ill-appearing.  She does do repetitive motions at work.  No tenderness to palpation to shoulder.  She does have significant tenderness as well as mild swelling to her mid right humerus.  No erythema or warmth.  Negative Hawkins or empty can.  Concern for possible DVT given exam and location of pain.  Unfortunately we do not have ultrasound present at this time.  Will schedule patient for outpatient study for DVT. Compartments soft. X-ray of right  shoulder shows age-related changes.  No evidence of induration or fluctuance to right shoulder.  Neurovascularly intact. No evidence of cellulitis or abscess. She denies chest pain, shortness of breath, hemoptysis, low suspicion for PE or atypical ACS.  Low suspicion for septic joint, gout, hemarthrosis, acute fracture dislocation.  No recent trauma or injury.  Discussed return precautions which require patient to return to the emergency department.  Discussed follow-up for her outpatient evaluation.  Patient voiced understanding of return precautions and is agreeable for follow-up.     Final Clinical Impressions(s) / ED Diagnoses   Final diagnoses:  Right arm pain    ED Discharge Orders         Ordered    US Venous Img Upper Uni Right     06/08/18 1835    naproxen (NAPROSYN) 500 MG tablet  2 times daily     06/08/18 1853    lidocaine (LIDODERM) 5 %  Every 24 hours     06/08/18 1853           Henderly, Britni A, PA-C 06/08/18 1900    Arby Barrette, MD 06/10/18 1134

## 2018-06-09 ENCOUNTER — Ambulatory Visit (HOSPITAL_BASED_OUTPATIENT_CLINIC_OR_DEPARTMENT_OTHER): Payer: BLUE CROSS/BLUE SHIELD

## 2018-06-10 ENCOUNTER — Ambulatory Visit (HOSPITAL_BASED_OUTPATIENT_CLINIC_OR_DEPARTMENT_OTHER)
Admission: RE | Admit: 2018-06-10 | Discharge: 2018-06-10 | Disposition: A | Discharge status: Home / Self Care | Payer: BLUE CROSS/BLUE SHIELD | Source: Ambulatory Visit | Attending: Physician Assistant | Admitting: Physician Assistant

## 2018-06-10 ENCOUNTER — Other Ambulatory Visit: Payer: Self-pay

## 2018-06-10 DIAGNOSIS — M79601 Pain in right arm: Secondary | ICD-10-CM | POA: Diagnosis not present

## 2018-06-10 NOTE — ED Provider Notes (Signed)
Patient returned for her upper extremity ultrasound.  It was negative for DVT.  Patient given results and advised to follow-up with PCP and orthopedics as discussed at her visit 2 days ago.  Patient given the opportunity for questions and all questions answered.  Patient understands and agrees with plan.   Emi Holes, PA-C 06/10/18 1118    Cathren Laine, MD 06/10/18 1440

## 2019-02-09 ENCOUNTER — Encounter (HOSPITAL_BASED_OUTPATIENT_CLINIC_OR_DEPARTMENT_OTHER): Payer: Self-pay | Admitting: *Deleted

## 2019-02-09 ENCOUNTER — Emergency Department (HOSPITAL_BASED_OUTPATIENT_CLINIC_OR_DEPARTMENT_OTHER): Payer: Self-pay

## 2019-02-09 ENCOUNTER — Emergency Department (HOSPITAL_BASED_OUTPATIENT_CLINIC_OR_DEPARTMENT_OTHER)
Admission: EM | Admit: 2019-02-09 | Discharge: 2019-02-09 | Disposition: A | Discharge status: Home / Self Care | Payer: Self-pay | Attending: Emergency Medicine | Admitting: Emergency Medicine

## 2019-02-09 ENCOUNTER — Other Ambulatory Visit: Payer: Self-pay

## 2019-02-09 DIAGNOSIS — I10 Essential (primary) hypertension: Secondary | ICD-10-CM | POA: Insufficient documentation

## 2019-02-09 DIAGNOSIS — M25561 Pain in right knee: Secondary | ICD-10-CM | POA: Insufficient documentation

## 2019-02-09 DIAGNOSIS — W010XXA Fall on same level from slipping, tripping and stumbling without subsequent striking against object, initial encounter: Secondary | ICD-10-CM | POA: Insufficient documentation

## 2019-02-09 DIAGNOSIS — Y999 Unspecified external cause status: Secondary | ICD-10-CM | POA: Insufficient documentation

## 2019-02-09 DIAGNOSIS — Y92018 Other place in single-family (private) house as the place of occurrence of the external cause: Secondary | ICD-10-CM | POA: Insufficient documentation

## 2019-02-09 DIAGNOSIS — Y9301 Activity, walking, marching and hiking: Secondary | ICD-10-CM | POA: Insufficient documentation

## 2019-02-09 MED ORDER — HYDROCODONE-ACETAMINOPHEN 5-325 MG PO TABS: 1.0000 | ORAL_TABLET | Freq: Four times a day (QID) | ORAL | 0 refills | Status: AC | PRN

## 2019-02-09 NOTE — ED Provider Notes (Signed)
[facility] EMERGENCY DEPARTMENT Provider Note   CSN: [id number] Arrival date & time: 02/09/19  1932     History   Chief Complaint Chief Complaint  Patient presents with  . Knee Pain    HPI Shannon Wallace is a 63 y.o. female.     Patient presenting with a complaint of right knee pain.  She had a fall 3 weeks ago hit it on the hard floors at home.  No other injuries no loss of consciousness.  Patient is continues to have pain and swelling in the knee.  She does have an orthopedic doctor but has not followed up with them.  Able to ambulate but it is painful.     Past Medical History:  Diagnosis Date  . Arthritis   . Headache(784.0)    frequent  . Hypertension     Patient Active Problem List   Diagnosis Date Noted  . Abdominal pain, epigastric 07/20/2010  . Retroperitoneal lymphadenopathy 07/20/2010    Past Surgical History:  Procedure Laterality Date  . APPENDECTOMY  1997  . CESAREAN SECTION       OB History   No obstetric history on file.      Home Medications    Prior to Admission medications   Medication Sig Start Date End Date Taking? Authorizing Provider  HYDROcodone-acetaminophen (NORCO) 5-325 MG per tablet Take 1 tablet by mouth. Every 4-6 hours as needed for pain.    Yes [provider]  Vitamin D, Ergocalciferol, (DRISDOL) 50000 units CAPS capsule Take 50,000 Units by mouth every 7 (seven) days.   Yes [provider]  azithromycin (ZITHROMAX Z-PAK) 250 MG tablet Take as directed, take 500 mg (2 tablets) today, then one (1) tablet a day for the next 4 days. 05/28/16   Lajean Saver, MD  HYDROcodone-acetaminophen (NORCO) 5-325 MG per tablet Take 1 tablet by mouth every 6 (six) hours as needed. 09/23/13   Palumbo, April, MD  HYDROcodone-acetaminophen (NORCO/VICODIN) 5-325 MG tablet Take 1 tablet by mouth every 6 (six) hours as needed for moderate pain. 02/09/19   Fredia Sorrow, MD  lidocaine (LIDODERM) 5 % Place 1 patch  onto the skin daily. Remove & Discard patch within 12 hours or as directed by MD 06/08/18   Henderly, Britni A, PA-C  naproxen (NAPROSYN) 500 MG tablet Take 1 tablet (500 mg total) by mouth 2 (two) times daily. 06/08/18   Henderly, Britni A, PA-C  ondansetron (ZOFRAN) 8 MG tablet Take 8 mg by mouth. Every 4 -6 hours as needed.     [provider]  ondansetron (ZOFRAN) 8 MG tablet Take 1 tablet (8 mg total) by mouth every 8 (eight) hours as needed for nausea. 02/09/13   Palumbo, April, MD    Family History Family History  Problem Relation Age of Onset  . Arthritis Mother   . Stroke Mother   . Hypertension Mother   . Diabetes Mother   . Arthritis Father   . Cancer Sister        hysterectomy due to abnormal pap smears    Social History Social History   Tobacco Use  . Smoking status: Never Smoker  . Smokeless tobacco: Never Used  Substance Use Topics  . Alcohol use: No  . Drug use: No     Allergies   Tuberculin tests   Review of Systems Review of Systems  Constitutional: Negative for chills and fever.  HENT: Negative for congestion, rhinorrhea and sore throat.   Eyes: Negative for visual  disturbance.  Respiratory: Negative for cough and shortness of breath.   Cardiovascular: Negative for chest pain and leg swelling.  Gastrointestinal: Negative for abdominal pain, diarrhea, nausea and vomiting.  Genitourinary: Negative for dysuria.  Musculoskeletal: Positive for joint swelling. Negative for back pain and neck pain.  Skin: Negative for rash.  Neurological: Negative for dizziness, light-headedness and headaches.  Hematological: Does not bruise/bleed easily.  Psychiatric/Behavioral: Negative for confusion.     Physical Exam Updated Vital Signs BP (!) 162/82   Pulse 87   Temp 98.7 F (37.1 C) (Oral)   Resp 20   Ht 1.549 m (5\' 1" )   Wt 80.7 kg   SpO2 99%   BMI 33.62 kg/m   Physical Exam Vitals signs and nursing note reviewed.  Constitutional:       General: She is not in acute distress.    Appearance: She is well-developed.  HENT:     Head: Normocephalic and atraumatic.  Eyes:     Extraocular Movements: Extraocular movements intact.     Conjunctiva/sclera: Conjunctivae normal.     Pupils: Pupils are equal, round, and reactive to light.  Neck:     Musculoskeletal: Normal range of motion and neck supple.  Cardiovascular:     Rate and Rhythm: Normal rate and regular rhythm.     Heart sounds: No murmur.  Pulmonary:     Effort: Pulmonary effort is normal. No respiratory distress.     Breath sounds: Normal breath sounds.  Abdominal:     Palpations: Abdomen is soft.     Tenderness: There is no abdominal tenderness.  Musculoskeletal:        General: Swelling and tenderness present. No deformity.     Comments: Swelling to the right knee.  Some tenderness to palpation around the patella.  Distally dorsalis pedis pulse 1+.  Sensation intact.  Left knee without any abnormalities.  Skin:    General: Skin is warm and dry.     Capillary Refill: Capillary refill takes less than 2 seconds.  Neurological:     General: No focal deficit present.     Mental Status: She is alert and oriented to person, place, and time.     Cranial Nerves: No cranial nerve deficit.     Sensory: No sensory deficit.     Motor: No weakness.      ED Treatments / Results  Labs (all labs ordered are listed, but only abnormal results are displayed) Labs Reviewed - No data to display  EKG None  Radiology Dg Knee Complete 4 Views Right  Result Date: 02/09/2019 CLINICAL DATA:  Pain status post fall three weeks ago EXAM: RIGHT KNEE - COMPLETE 4+ VIEW COMPARISON:  None. FINDINGS: There is no acute displaced fracture or dislocation. There is a moderate-sized joint effusion. The joint spaces are relatively well preserved. IMPRESSION: 1. No acute displaced fracture or dislocation. 2. Joint effusion. If there is clinical concern for an occult fracture, follow-up with  CT or MRI is recommended. Electronically Signed   By: Katherine Mantlehristopher  Green M.D.   On: 02/09/2019 20:28    Procedures Procedures (including critical care time)  Medications Ordered in ED Medications - No data to display   Initial Impression / Assessment and Plan / ED Course  I have reviewed the triage vital signs and the nursing notes.  Pertinent labs & imaging results that were available during my care of the patient were reviewed by me and considered in my medical decision making (see chart for details).  X-ray of the right knee without any acute bony abnormalities but there is evidence of effusion.  Patient is 3 weeks out from the injury.  If there was a subtle hairline fracture would suspect some healing to show up.  However not completely ruled out.  We will treat patient with knee immobilizer some pain medication follow-up with her orthopedic doctor.  And or our sports medicine specialist upstairs.  Final Clinical Impressions(s) / ED Diagnoses   Final diagnoses:  Acute pain of right knee    ED Discharge Orders         Ordered    HYDROcodone-acetaminophen (NORCO/VICODIN) 5-325 MG tablet  Every 6 hours PRN     02/09/19 2216           Vanetta Mulders, MD 02/09/19 2228

## 2019-02-09 NOTE — ED Triage Notes (Signed)
Right knee pain. 3 weeks ago her knee gave out and she fell. She has been able to ambulate since that time.

## 2019-02-09 NOTE — Discharge Instructions (Signed)
Wear the knee immobilizer at all times except for bathing.  Make an appointment to follow-up either with your orthopedic doctor or sports medicine information provided above.  Take the hydrocodone as needed for pain.  Today's x-rays of the right knee had no acute bony injury but does show fluid on the knee suggestive of some type of injury.  Follow-up with either sports medicine or orthopedics is important.

## 2019-09-12 ENCOUNTER — Other Ambulatory Visit: Payer: Self-pay

## 2019-09-12 ENCOUNTER — Encounter (HOSPITAL_BASED_OUTPATIENT_CLINIC_OR_DEPARTMENT_OTHER): Payer: Self-pay | Admitting: Emergency Medicine

## 2019-09-12 ENCOUNTER — Emergency Department (HOSPITAL_BASED_OUTPATIENT_CLINIC_OR_DEPARTMENT_OTHER)
Admission: EM | Admit: 2019-09-12 | Discharge: 2019-09-12 | Disposition: A | Discharge status: Home / Self Care | Payer: Self-pay | Attending: Emergency Medicine | Admitting: Emergency Medicine

## 2019-09-12 DIAGNOSIS — I10 Essential (primary) hypertension: Secondary | ICD-10-CM | POA: Insufficient documentation

## 2019-09-12 DIAGNOSIS — K029 Dental caries, unspecified: Secondary | ICD-10-CM | POA: Insufficient documentation

## 2019-09-12 MED ORDER — PENICILLIN V POTASSIUM 500 MG PO TABS: 500.0000 mg | ORAL_TABLET | Freq: Four times a day (QID) | ORAL | 0 refills | Status: AC

## 2019-09-12 NOTE — ED Provider Notes (Signed)
[facility] EMERGENCY DEPARTMENT Provider Note   CSN: [id number] Arrival date & time: 09/12/19  1133     History Chief Complaint  Patient presents with  . Dental Pain    Shannon Wallace is a 64 y.o. female.  Shannon Wallace is a 64 y.o. female with history of arthritis, hypertension, headaches, who presents to the ED for evaluation of right lower dental pain.  She reports that she has 2 teeth that have broken off at the gumline and are decaying and she needs to have them removed, she is trying to find a dentist that can put her to sleep and remove them both at the same time.  She reports pain has become more persistent over the past 3 days she has some swelling around the gums but no drainage or visible abscess.  No pain beneath the tongue.  No fevers or chills.  No nausea or vomiting.  Pain with chewing but no difficulty swallowing.  Has had similar problem with the right upper teeth and had to have them moved.  Not been on any antibiotics recently.        Past Medical History:  Diagnosis Date  . Arthritis   . Headache(784.0)    frequent  . Hypertension     Patient Active Problem List   Diagnosis Date Noted  . Abdominal pain, epigastric 07/20/2010  . Retroperitoneal lymphadenopathy 07/20/2010    Past Surgical History:  Procedure Laterality Date  . APPENDECTOMY  1997  . CESAREAN SECTION       OB History   No obstetric history on file.     Family History  Problem Relation Age of Onset  . Arthritis Mother   . Stroke Mother   . Hypertension Mother   . Diabetes Mother   . Arthritis Father   . Cancer Sister        hysterectomy due to abnormal pap smears    Social History   Tobacco Use  . Smoking status: Never Smoker  . Smokeless tobacco: Never Used  Vaping Use  . Vaping Use: Never used  Substance Use Topics  . Alcohol use: No  . Drug use: No    Home Medications Prior to Admission medications   Medication Sig Start Date End Date Taking?  Authorizing Provider  azithromycin (ZITHROMAX Z-PAK) 250 MG tablet Take as directed, take 500 mg (2 tablets) today, then one (1) tablet a day for the next 4 days. 05/28/16   Lajean Saver, MD  HYDROcodone-acetaminophen (NORCO) 5-325 MG per tablet Take 1 tablet by mouth. Every 4-6 hours as needed for pain.     [provider]  HYDROcodone-acetaminophen (NORCO) 5-325 MG per tablet Take 1 tablet by mouth every 6 (six) hours as needed. 09/23/13   Palumbo, April, MD  HYDROcodone-acetaminophen (NORCO/VICODIN) 5-325 MG tablet Take 1 tablet by mouth every 6 (six) hours as needed for moderate pain. 02/09/19   Fredia Sorrow, MD  lidocaine (LIDODERM) 5 % Place 1 patch onto the skin daily. Remove & Discard patch within 12 hours or as directed by MD 06/08/18   Henderly, Britni A, PA-C  naproxen (NAPROSYN) 500 MG tablet Take 1 tablet (500 mg total) by mouth 2 (two) times daily. 06/08/18   Henderly, Britni A, PA-C  ondansetron (ZOFRAN) 8 MG tablet Take 8 mg by mouth. Every 4 -6 hours as needed.     [provider]  ondansetron (ZOFRAN) 8 MG tablet Take 1 tablet (8 mg total) by mouth every 8 (eight)  hours as needed for nausea. 02/09/13   Palumbo, April, MD  penicillin v potassium (VEETID) 500 MG tablet Take 1 tablet (500 mg total) by mouth 4 (four) times daily for 7 days. 09/12/19 09/19/19  Dartha Lodge, PA-C  Vitamin D, Ergocalciferol, (DRISDOL) 50000 units CAPS capsule Take 50,000 Units by mouth every 7 (seven) days.    [provider]    Allergies    Tuberculin tests  Review of Systems   Review of Systems  Constitutional: Negative for chills and fever.  HENT: Positive for dental problem. Negative for facial swelling and sore throat.   Gastrointestinal: Negative for nausea and vomiting.  Musculoskeletal: Negative for neck pain and neck stiffness.  All other systems reviewed and are negative.   Physical Exam Updated Vital Signs BP (!) 152/100   Pulse (!) 51   Temp 98 F (36.7  C) (Oral)   Resp 18   Ht 5\' 1"  (1.549 m)   Wt 77.1 kg   SpO2 100%   BMI 32.12 kg/m   Physical Exam Vitals and nursing note reviewed.  Constitutional:      General: She is not in acute distress.    Appearance: Normal appearance. She is well-developed. She is not ill-appearing or diaphoretic.  HENT:     Head: Normocephalic and atraumatic.     Mouth/Throat:     Comments: Multiple teeth missing urine decay, right lower molar is broken off at the level of the gum, with decay and surrounding erythema of the gums but no drainable abscess.  No sublingual tenderness or induration.  Posterior oropharynx is clear, normal phonation, tolerating secretions Eyes:     General:        Right eye: No discharge.        Left eye: No discharge.  Neck:     Comments: No tenderness or swelling, no torticollis Pulmonary:     Effort: Pulmonary effort is normal. No respiratory distress.  Musculoskeletal:        General: No deformity.  Skin:    General: Skin is warm and dry.  Neurological:     Mental Status: She is alert and oriented to person, place, and time.     Coordination: Coordination normal.  Psychiatric:        Mood and Affect: Mood normal.        Behavior: Behavior normal.     ED Results / Procedures / Treatments   Labs (all labs ordered are listed, but only abnormal results are displayed) Labs Reviewed - No data to display  EKG None  Radiology No results found.  Procedures Procedures (including critical care time)  Medications Ordered in ED Medications - No data to display  ED Course  I have reviewed the triage vital signs and the nursing notes.  Pertinent labs & imaging results that were available during my care of the patient were reviewed by me and considered in my medical decision making (see chart for details).    MDM Rules/Calculators/A&P                          Patient with toothache.  No gross abscess.  Exam unconcerning for Ludwig's angina or spread of  infection.  Will treat with penicillin and anti-inflammatories medicine.  Urged patient to follow-up with dentist.   Final Clinical Impression(s) / ED Diagnoses Final diagnoses:  Dental caries    Rx / DC Orders ED Discharge Orders  Ordered    penicillin v potassium (VEETID) 500 MG tablet  4 times daily     Discontinue  Reprint     09/12/19 1306           Dartha Lodge, [place] 09/12/19 1320    Arby Barrette, MD 09/13/19 1145

## 2019-09-12 NOTE — ED Notes (Signed)
Rt side dental pain x 3 days

## 2019-09-12 NOTE — ED Triage Notes (Signed)
Pt here with right sided dental pain and abscess for 3 days.

## 2019-09-12 NOTE — Discharge Instructions (Addendum)
Please take entire course of antibiotics as directed.  Continue using ibuprofen and Tylenol for pain.  You will need to follow-up with your dentist for continued management of this.  Return to the emergency department for fevers, swelling or pain under the tongue or in the neck, difficulty breathing or swallowing or any other new or concerning symptoms.  

## 2021-01-05 ENCOUNTER — Emergency Department (HOSPITAL_BASED_OUTPATIENT_CLINIC_OR_DEPARTMENT_OTHER): Payer: Self-pay

## 2021-01-05 ENCOUNTER — Emergency Department (HOSPITAL_BASED_OUTPATIENT_CLINIC_OR_DEPARTMENT_OTHER)
Admission: EM | Admit: 2021-01-05 | Discharge: 2021-01-05 | Disposition: A | Discharge status: Home / Self Care | Payer: Self-pay | Attending: Student | Admitting: Student

## 2021-01-05 ENCOUNTER — Encounter (HOSPITAL_BASED_OUTPATIENT_CLINIC_OR_DEPARTMENT_OTHER): Payer: Self-pay | Admitting: Emergency Medicine

## 2021-01-05 ENCOUNTER — Other Ambulatory Visit (HOSPITAL_BASED_OUTPATIENT_CLINIC_OR_DEPARTMENT_OTHER): Payer: Self-pay

## 2021-01-05 ENCOUNTER — Other Ambulatory Visit: Payer: Self-pay

## 2021-01-05 DIAGNOSIS — Y92007 Garden or yard of unspecified non-institutional (private) residence as the place of occurrence of the external cause: Secondary | ICD-10-CM | POA: Insufficient documentation

## 2021-01-05 DIAGNOSIS — M25562 Pain in left knee: Secondary | ICD-10-CM | POA: Insufficient documentation

## 2021-01-05 DIAGNOSIS — W19XXXA Unspecified fall, initial encounter: Secondary | ICD-10-CM | POA: Insufficient documentation

## 2021-01-05 DIAGNOSIS — Y9389 Activity, other specified: Secondary | ICD-10-CM | POA: Insufficient documentation

## 2021-01-05 DIAGNOSIS — I10 Essential (primary) hypertension: Secondary | ICD-10-CM | POA: Insufficient documentation

## 2021-01-05 DIAGNOSIS — G8929 Other chronic pain: Secondary | ICD-10-CM | POA: Insufficient documentation

## 2021-01-05 MED ORDER — ACETAMINOPHEN 325 MG PO TABS
650.0000 mg | ORAL_TABLET | Freq: Three times a day (TID) | ORAL | 0 refills | Status: AC
  Filled 2021-01-05: qty 200, 34d supply, fill #0

## 2021-01-05 MED ORDER — KETOROLAC TROMETHAMINE 15 MG/ML IJ SOLN
15.0000 mg | Freq: Once | INTRAMUSCULAR | Status: AC
  Administered 2021-01-05: 15 mg via INTRAMUSCULAR
  Filled 2021-01-05: qty 1

## 2021-01-05 MED ORDER — KETOROLAC TROMETHAMINE 15 MG/ML IJ SOLN: 15.0000 mg | Freq: Once | INTRAMUSCULAR | Status: DC

## 2021-01-05 MED ORDER — IBUPROFEN 600 MG PO TABS
600.0000 mg | ORAL_TABLET | Freq: Four times a day (QID) | ORAL | 0 refills | Status: AC | PRN
  Filled 2021-01-05: qty 30, 8d supply, fill #0

## 2021-01-05 NOTE — ED Triage Notes (Signed)
L knee pain since mechanical fall ~1 month ago with impact to knee. Pt is ambulatory with a limp. She states she tried giving it time to heal but it isn't any better.

## 2021-01-05 NOTE — ED Provider Notes (Signed)
[facility] EMERGENCY DEPARTMENT Provider Note   CSN: [id number] Arrival date & time: 01/05/21  2542     History Chief Complaint  Patient presents with   Knee Pain    Shannon Wallace is a 65 y.o. female who presents to the emergency department for evaluation of left knee pain.  Patient states she had a mechanical fall approximately 1 month ago while working in the yard and landed onto the patella.  She states that she has been able to ambulate on the knee since then but is concerned that her knee pain is not improving.  She has been taking ibuprofen and Tylenol without relief.  Denies numbness, tingling, weakness to lower extremity.  Pain is a 4 out of 10, nonradiating and worsens throughout the day as she walks on the knee.  Pain localized to the proximal portion of the tibia and localizes to the patellar tendon.  Denies chest pain, shortness of breath, Donnell pain, nausea, vomiting or other systemic symptoms.   Knee Pain Associated symptoms: no back pain and no fever       Past Medical History:  Diagnosis Date   Arthritis    Headache(784.0)    frequent   Hypertension     Patient Active Problem List   Diagnosis Date Noted   Abdominal pain, epigastric 07/20/2010   Retroperitoneal lymphadenopathy 07/20/2010    Past Surgical History:  Procedure Laterality Date   APPENDECTOMY  03/30/1995   CESAREAN SECTION     ROTATOR CUFF REPAIR Right      OB History   No obstetric history on file.     Family History  Problem Relation Age of Onset   Arthritis Mother    Stroke Mother    Hypertension Mother    Diabetes Mother    Arthritis Father    Cancer Sister        hysterectomy due to abnormal pap smears    Social History   Tobacco Use   Smoking status: Never   Smokeless tobacco: Never  Vaping Use   Vaping Use: Never used  Substance Use Topics   Alcohol use: No   Drug use: No    Home Medications Prior to Admission medications   Medication Sig Start  Date End Date Taking? Authorizing Provider  acetaminophen (TYLENOL) 325 MG tablet Take 2 tablets (650 mg total) by mouth every 8 (eight) hours. 01/05/21 02/04/21 Yes Delyle Weider, MD  ibuprofen (ADVIL) 600 MG tablet Take 1 tablet (600 mg total) by mouth every 6 (six) hours as needed. 01/05/21  Yes Onix Jumper, MD  azithromycin (ZITHROMAX Z-PAK) 250 MG tablet Take as directed, take 500 mg (2 tablets) today, then one (1) tablet a day for the next 4 days. 05/28/16   Cathren Laine, MD  HYDROcodone-acetaminophen (NORCO) 5-325 MG per tablet Take 1 tablet by mouth. Every 4-6 hours as needed for pain.     [provider]  HYDROcodone-acetaminophen (NORCO) 5-325 MG per tablet Take 1 tablet by mouth every 6 (six) hours as needed. 09/23/13   Palumbo, April, MD  HYDROcodone-acetaminophen (NORCO/VICODIN) 5-325 MG tablet Take 1 tablet by mouth every 6 (six) hours as needed for moderate pain. 02/09/19   Vanetta Mulders, MD  lidocaine (LIDODERM) 5 % Place 1 patch onto the skin daily. Remove & Discard patch within 12 hours or as directed by MD 06/08/18   Henderly, Britni A, PA-C  naproxen (NAPROSYN) 500 MG tablet Take 1 tablet (500 mg total) by mouth 2 (two) times daily.  06/08/18   Henderly, Britni A, PA-C  ondansetron (ZOFRAN) 8 MG tablet Take 8 mg by mouth. Every 4 -6 hours as needed.     [provider]  ondansetron (ZOFRAN) 8 MG tablet Take 1 tablet (8 mg total) by mouth every 8 (eight) hours as needed for nausea. 02/09/13   Palumbo, April, MD  Vitamin D, Ergocalciferol, (DRISDOL) 50000 units CAPS capsule Take 50,000 Units by mouth every 7 (seven) days.    [provider]    Allergies    Tuberculin tests  Review of Systems   Review of Systems  Constitutional:  Negative for chills and fever.  HENT:  Negative for ear pain and sore throat.   Eyes:  Negative for pain and visual disturbance.  Respiratory:  Negative for cough and shortness of breath.   Cardiovascular:  Negative for  chest pain and palpitations.  Gastrointestinal:  Negative for abdominal pain and vomiting.  Genitourinary:  Negative for dysuria and hematuria.  Musculoskeletal:  Negative for arthralgias and back pain.       Left knee pain  Skin:  Negative for color change and rash.  Neurological:  Negative for seizures and syncope.  All other systems reviewed and are negative.  Physical Exam Updated Vital Signs BP (!) 172/90 (BP Location: Right Arm)   Pulse (!) 57   Temp 97.9 F (36.6 C) (Oral)   Resp 18   Ht 5\' 1"  (1.549 m)   Wt 77.1 kg   SpO2 99%   BMI 32.12 kg/m   Physical Exam Vitals and nursing note reviewed.  Constitutional:      General: She is not in acute distress.    Appearance: She is well-developed.  HENT:     Head: Normocephalic and atraumatic.  Eyes:     Conjunctiva/sclera: Conjunctivae normal.  Cardiovascular:     Rate and Rhythm: Normal rate and regular rhythm.     Heart sounds: No murmur heard. Pulmonary:     Effort: Pulmonary effort is normal. No respiratory distress.     Breath sounds: Normal breath sounds.  Abdominal:     Palpations: Abdomen is soft.     Tenderness: There is no abdominal tenderness.  Musculoskeletal:        General: Swelling and tenderness (Patellar tendon on the left, proximal tibia) present.     Cervical back: Neck supple.  Skin:    General: Skin is warm and dry.  Neurological:     Mental Status: She is alert.    ED Results / Procedures / Treatments   Labs (all labs ordered are listed, but only abnormal results are displayed) Labs Reviewed - No data to display  EKG None  Radiology DG Knee Complete 4 Views Left  Result Date: 01/05/2021 CLINICAL DATA:  65 year old female status post fall 1 month ago with continued pain, limp. EXAM: LEFT KNEE - COMPLETE 4+ VIEW COMPARISON:  Left knee series 01/20/2020. FINDINGS: Bone mineralization is within normal limits for age. Joint spaces and alignment appear stable since last year and normal for  age. But there is evidence of a small to moderate joint effusion now. No acute osseous abnormality identified. No discrete soft tissue injury. IMPRESSION: Positive for up to moderate left knee joint effusion. No acute osseous abnormality identified, joint spaces normal for age. Electronically Signed   By: 01/22/2020 M.D.   On: 01/05/2021 07:54    Procedures Procedures   Medications Ordered in ED Medications  ketorolac (TORADOL) 15 MG/ML injection 15 mg (15 mg  Intramuscular Given 01/05/21 0819)    ED Course  I have reviewed the triage vital signs and the nursing notes.  Pertinent labs & imaging results that were available during my care of the patient were reviewed by me and considered in my medical decision making (see chart for details).    MDM Rules/Calculators/A&P                           Patient seen emergency department for evaluation of left knee pain.  Physical exam reveals tenderness to the proximal tibia at the site of the patellar tendon.  Patient is full range of motion of the knee, pulses intact.  Low suspicion for septic joint as there is no erythema, warmth or lack of range of motion.  X-ray reveals small to moderate joint effusion but is otherwise unremarkable.  Patient presentation likely consistent with osteoarthritis and an Ace wrap was applied here in the emergency department.  We did discussion about pain control and the patient will trial scheduled Tylenol 3 times daily 650 mg with ibuprofen for breakthrough pain.  Patient then discharged with primary care follow-up.  Outpatient orthopedic evaluation resources also given to the patient. Final Clinical Impression(s) / ED Diagnoses Final diagnoses:  Chronic pain of left knee    Rx / DC Orders ED Discharge Orders          Ordered    ibuprofen (ADVIL) 600 MG tablet  Every 6 hours PRN        01/05/21 0849    acetaminophen (TYLENOL) 325 MG tablet  Every 8 hours        01/05/21 0849             Glendora Score,  MD 01/05/21 [phone]

## 2021-01-05 NOTE — ED Notes (Signed)
ED Provider at bedside. 

## 2022-06-18 IMAGING — CR DG KNEE COMPLETE 4+V*L*
4 series · 4 of 4 positions shown · non-contrast
Comparison: Left knee series 01/20/2020.

CLINICAL DATA: 65-year-old female status post fall 1 month ago with
continued pain, limp.

EXAM:
LEFT KNEE - COMPLETE 4+ VIEW

[t knee ap left]
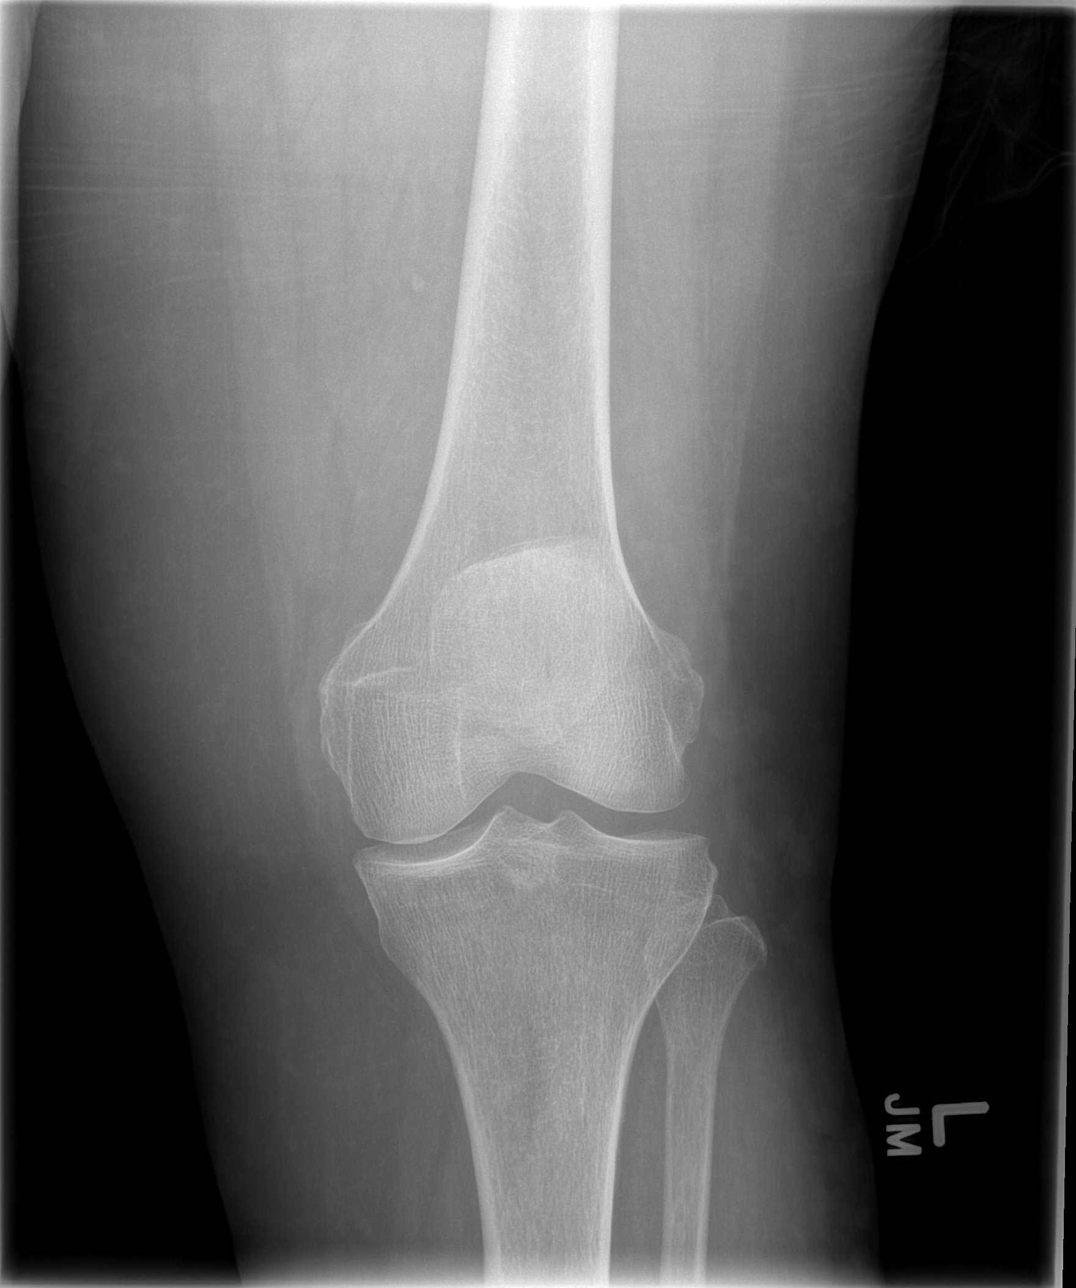

[t knee oblique left (1 of 2)]
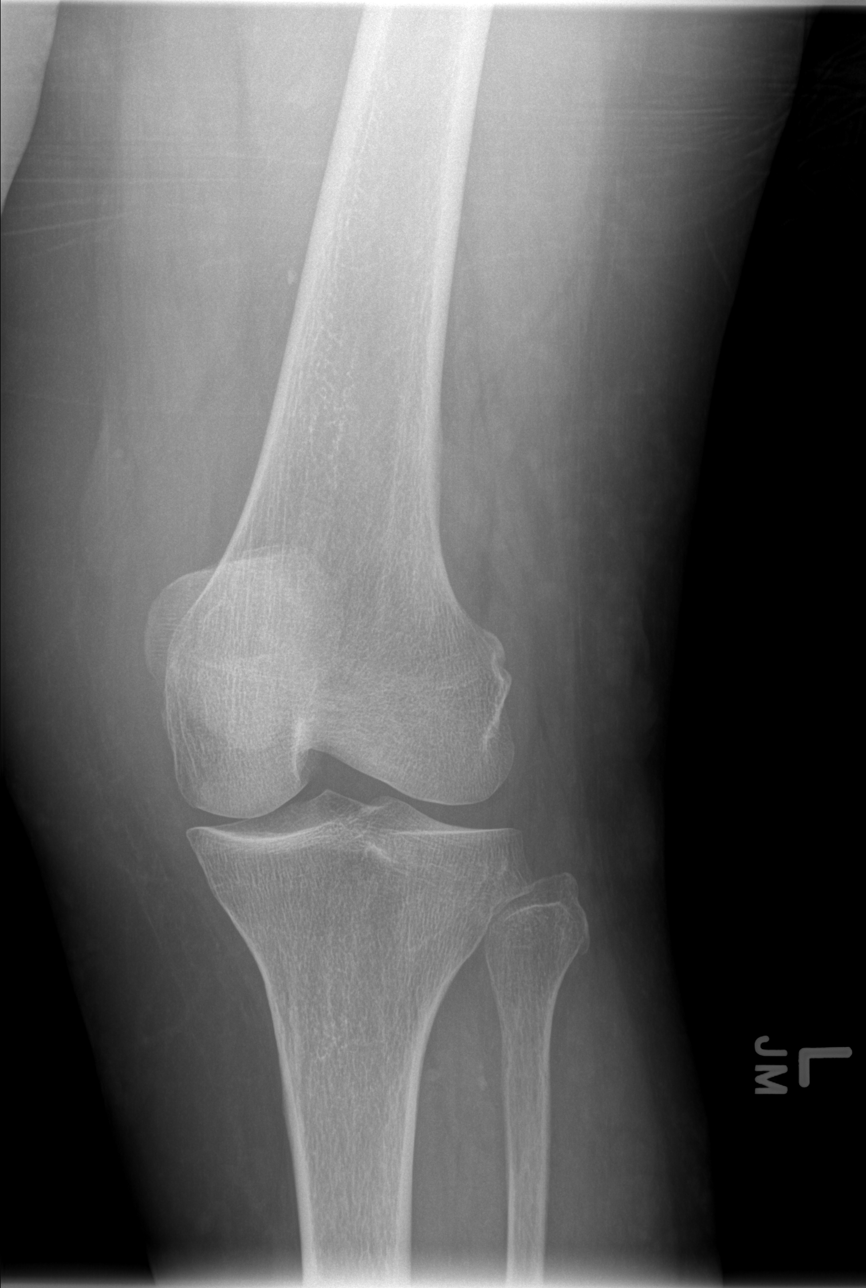

[t knee oblique left (2 of 2)]
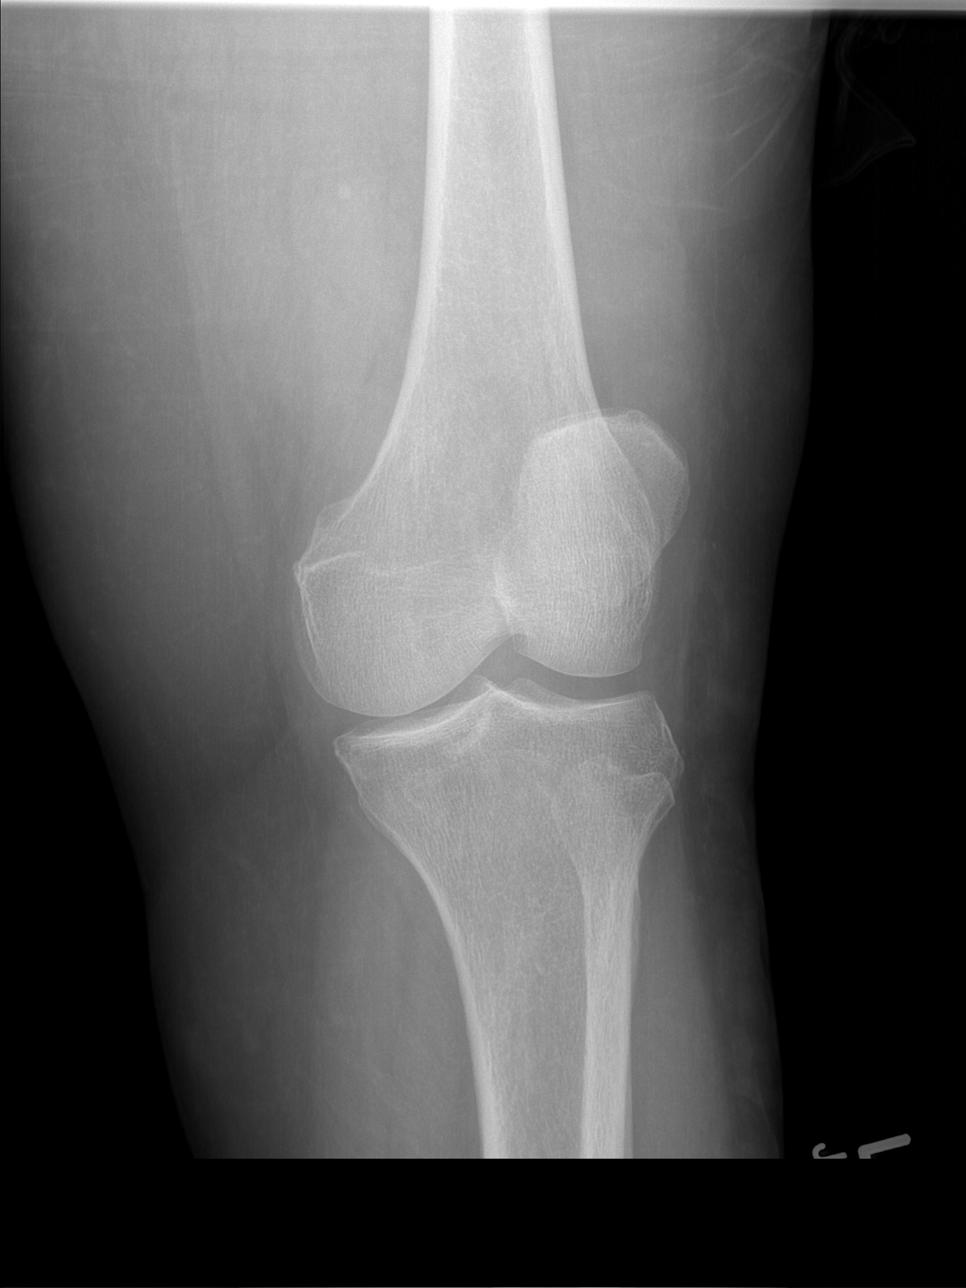

[t knee lat left]
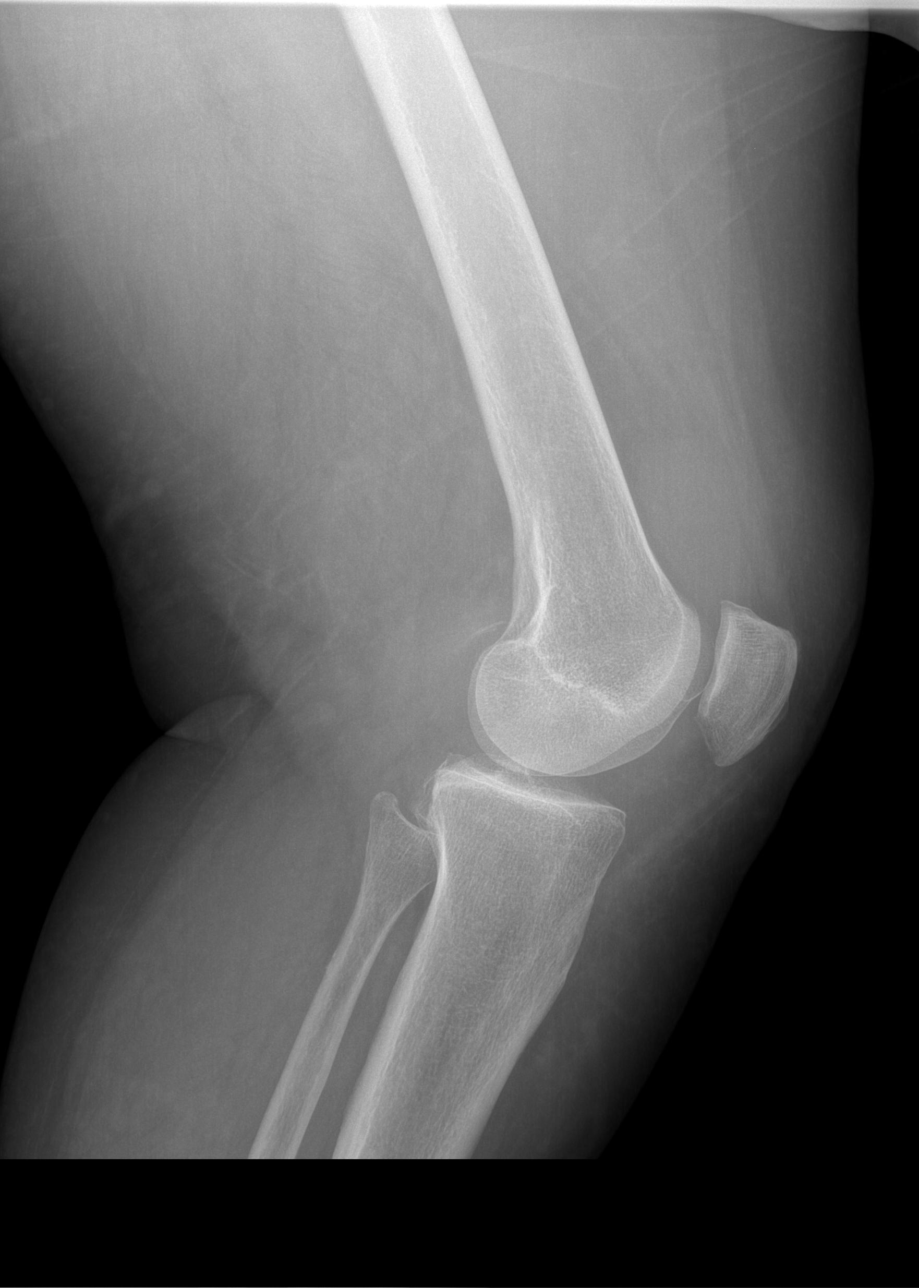

[4 of 4 positions shown; findings below may reference images not displayed]

FINDINGS: Bone mineralization is within normal limits for age. Joint spaces
and alignment appear stable since last year and normal for age. But
there is evidence of a small to moderate joint effusion now. No
acute osseous abnormality identified. No discrete soft tissue
injury.
IMPRESSION: Positive for up to moderate left knee joint effusion. No acute
osseous abnormality identified, joint spaces normal for age.

## 2023-09-20 ENCOUNTER — Other Ambulatory Visit: Payer: Self-pay

## 2023-09-20 ENCOUNTER — Encounter (HOSPITAL_BASED_OUTPATIENT_CLINIC_OR_DEPARTMENT_OTHER): Payer: Self-pay | Admitting: Emergency Medicine

## 2023-09-20 ENCOUNTER — Emergency Department (HOSPITAL_BASED_OUTPATIENT_CLINIC_OR_DEPARTMENT_OTHER)
Admission: EM | Admit: 2023-09-20 | Discharge: 2023-09-20 | Disposition: A | Discharge status: Home / Self Care | Attending: Emergency Medicine | Admitting: Emergency Medicine

## 2023-09-20 DIAGNOSIS — T162XXA Foreign body in left ear, initial encounter: Secondary | ICD-10-CM | POA: Insufficient documentation

## 2023-09-20 DIAGNOSIS — W458XXA Other foreign body or object entering through skin, initial encounter: Secondary | ICD-10-CM | POA: Insufficient documentation

## 2023-09-20 NOTE — ED Triage Notes (Signed)
 Reports a piece of feather in left ear , pain .

## 2023-09-20 NOTE — Discharge Instructions (Signed)
 You were seen in the ER  Contact a doctor if: You have a headache. You have a fever. You have pain or swelling that gets worse. You have decreased hearing in your ear. You have ringing in your ear. Get help right away if: You notice blood or fluid coming from your ear. You have sudden hearing loss.

## 2023-09-20 NOTE — ED Notes (Signed)
 Pt alert and oriented X 4 at the time of discharge. RR even and unlabored. No acute distress noted. Pt verbalized understanding of discharge instructions as discussed. Pt ambulatory to lobby at time of discharge.

## 2023-09-20 NOTE — ED Provider Notes (Signed)
 [place] EMERGENCY DEPARTMENT AT [facility] Provider Note   CSN: [id number] Arrival date & time: 09/20/23  1646     Patient presents with: Foreign Body in Ear   Shannon Wallace is a 68 y.o. female presents emerged from today for evaluation of foreign body in ear.  Patient reports earlier this morning she was trying to itch her ear and used a feather to see if this is going to help but a small piece of the father broke off.  She denies any pain in her ear or any otorrhea.  No fever.  She presented to the emergency department for removal of this.   Foreign Body in Ear       Prior to Admission medications   Medication Sig Start Date End Date Taking? Authorizing Provider  azithromycin  (ZITHROMAX  Z-PAK) 250 MG tablet Take as directed, take 500 mg (2 tablets) today, then one (1) tablet a day for the next 4 days. 05/28/16   Bernard Drivers, MD  HYDROcodone -acetaminophen  (NORCO) 5-325 MG per tablet Take 1 tablet by mouth. Every 4-6 hours as needed for pain.     [provider]  HYDROcodone -acetaminophen  (NORCO) 5-325 MG per tablet Take 1 tablet by mouth every 6 (six) hours as needed. 09/23/13   Palumbo, April, MD  HYDROcodone -acetaminophen  (NORCO/VICODIN) 5-325 MG tablet Take 1 tablet by mouth every 6 (six) hours as needed for moderate pain. 02/09/19   Zackowski, Scott, MD  ibuprofen  (ADVIL ) 600 MG tablet Take 1 tablet (600 mg total) by mouth every 6 (six) hours as needed. 01/05/21   Kommor, [place], MD  lidocaine  (LIDODERM ) 5 % Place 1 patch onto the skin daily. Remove & Discard patch within 12 hours or as directed by MD 06/08/18   Henderly, Britni A, PA-C  naproxen  (NAPROSYN ) 500 MG tablet Take 1 tablet (500 mg total) by mouth 2 (two) times daily. 06/08/18   Henderly, Britni A, PA-C  ondansetron  (ZOFRAN ) 8 MG tablet Take 8 mg by mouth. Every 4 -6 hours as needed.     [provider]  ondansetron  (ZOFRAN ) 8 MG tablet Take 1 tablet (8 mg total) by mouth every 8 (eight)  hours as needed for nausea. 02/09/13   Palumbo, April, MD  Vitamin D, Ergocalciferol, (DRISDOL) 50000 units CAPS capsule Take 50,000 Units by mouth every 7 (seven) days.    [provider]    Allergies: Tuberculin tests    Review of Systems  Constitutional:  Negative for chills and fever.  HENT:  Negative for ear discharge, ear pain and hearing loss.     Updated Vital Signs BP (!) 152/69 (BP Location: Left Arm)   Pulse 74   Temp 98.2 F (36.8 C)   Resp 18   Wt 83.9 kg   SpO2 95%   BMI 34.96 kg/m   Physical Exam Vitals and nursing note reviewed.  Constitutional:      General: She is not in acute distress.    Appearance: She is not toxic-appearing.  HENT:     Left Ear: Tympanic membrane normal.     Ears:     Comments: Small's father present to the ED.  For left TM just the external ear canal opening.  TM intact.  No bleeding or discharge noted.  Some dried wax present.    Mouth/Throat:     Mouth: Mucous membranes are moist.   Cardiovascular:     Rate and Rhythm: Normal rate.  Pulmonary:     Effort: Pulmonary effort is normal.  No respiratory distress.   Skin:    General: Skin is warm and dry.   Neurological:     Mental Status: She is alert.     (all labs ordered are listed, but only abnormal results are displayed) Labs Reviewed - No data to display  EKG: None  Radiology: No results found.  .Foreign Body Removal  Date/Time: 09/20/2023 5:46 PM  Performed by: Bernis Ernst, PA-C Authorized by: Bernis Ernst, PA-C  Consent: Verbal consent obtained Risks and benefits: risks, benefits and alternatives were discussed Consent given by: patient Patient understanding: patient states understanding of the procedure being performed Patient identity confirmed: verbally with patient Body area: ear Location details: left ear Patient cooperative: yes Localization method: visualized Removal mechanism: forceps and curette Complexity: simple 1 objects  recovered. Objects recovered: feather Post-procedure assessment: foreign body removed Patient tolerance: patient tolerated the procedure well with no immediate complications     Medications Ordered in the ED - No data to display  Medical Decision Making  68 y.o. female presents to the ER today for evaluation of FB in left ear. Differential diagnosis includes but is not limited to FB, TM perforation, trauma. Vital signs mildly elevated BP, otherwise unremarkable. Physical exam as noted above.   Patient has small feather in her ear.  Was able to remove it with some alligator forceps given that the stem was visualized.  Did use one of the lit ear cerumen wax remover's as countertraction.  Father was removed on first attempt.  No bleeding or trauma noted to the ear canal however she does have some dried wax which could be causing some of her itching.  Recommended using some topical Debrox.  TM is intact.  She has no tenderness upon manipulation of the pinna or tenderness to the mastoid area.  Ear appears atraumatic.  We discussed return precautions, patient stable for discharge home.  We discussed plan at bedside. We discussed strict return precautions and red flag symptoms. The patient verbalized their understanding and agrees to the plan. The patient is stable and being discharged home in good condition.  Portions of this report may have been transcribed using voice recognition software. Every effort was made to ensure accuracy; however, inadvertent computerized transcription errors may be present.    Final diagnoses:  Foreign body of left ear, initial encounter    ED Discharge Orders     None          Bernis Ernst, PA-C 09/20/23 1907    Ruthe Cornet, DO 09/20/23 1920

## 2023-09-25 ENCOUNTER — Encounter (HOSPITAL_BASED_OUTPATIENT_CLINIC_OR_DEPARTMENT_OTHER): Payer: Self-pay

## 2023-09-25 ENCOUNTER — Emergency Department (HOSPITAL_BASED_OUTPATIENT_CLINIC_OR_DEPARTMENT_OTHER)
Admission: EM | Admit: 2023-09-25 | Discharge: 2023-09-25 | Disposition: A | Discharge status: Home / Self Care | Attending: Emergency Medicine | Admitting: Emergency Medicine

## 2023-09-25 ENCOUNTER — Other Ambulatory Visit: Payer: Self-pay

## 2023-09-25 DIAGNOSIS — I10 Essential (primary) hypertension: Secondary | ICD-10-CM | POA: Insufficient documentation

## 2023-09-25 DIAGNOSIS — R220 Localized swelling, mass and lump, head: Secondary | ICD-10-CM | POA: Diagnosis present

## 2023-09-25 DIAGNOSIS — T7840XA Allergy, unspecified, initial encounter: Secondary | ICD-10-CM | POA: Diagnosis not present

## 2023-09-25 MED ORDER — DIPHENHYDRAMINE HCL 25 MG PO TABS: 25.0000 mg | ORAL_TABLET | Freq: Four times a day (QID) | ORAL | 0 refills | Status: AC | PRN

## 2023-09-25 MED ORDER — PREDNISONE 10 MG (21) PO TBPK: ORAL_TABLET | Freq: Every day | ORAL | 0 refills | Status: AC

## 2023-09-25 NOTE — Discharge Instructions (Addendum)
 It was a pleasure caring for you today in the emergency department.  You are likely experiencing a reaction from the TB test, the medicine prescribed today should help this get better faster. Please follow up with your pcp for recheck. Please avoid the TB skin test in the future. Obtaining an xray or a blood test can also detect TB   You can also apply topical hydrocortisone to the location on your forearm that is itchy, do not put this on your face  Please return to the emergency department for any worsening or worrisome symptoms.

## 2023-09-25 NOTE — ED Triage Notes (Signed)
 Pt is concerned about left forearm swelling at site of TB test. 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

## 2023-09-25 NOTE — ED Provider Notes (Signed)
 [place] [facility] AT [facility] Provider Note  CSN: [id number] Arrival date & time: 09/25/23 0745  Chief Complaint(s) Medication Reaction and Facial Swelling  HPI ORVILLE WIDMANN is a 68 y.o. female with past medical history as below, significant for arthritis, hypertension who presents to the ED with complaint of possible allergic reaction  Patient recently received a tuberculin skin test.  She has had reactions to the tuberculin skin test in the past and is having similar reaction today.  Having some itchiness around the skin test location, some slight discomfort.  No redness or warmth, no drainage.  Location of concern is on her left forearm.  She has no paresthesias, numbness or weakness to the affected extremity.  She is also concerned for some slight irritation to the left side of her face.  Past Medical History Past Medical History:  Diagnosis Date   Arthritis    Headache(784.0)    frequent   Hypertension    Patient Active Problem List   Diagnosis Date Noted   Abdominal pain, epigastric 07/20/2010   Retroperitoneal lymphadenopathy 07/20/2010   Home Medication(s) Prior to Admission medications   Medication Sig Start Date End Date Taking? Authorizing Provider  diphenhydrAMINE (BENADRYL) 25 MG tablet Take 1 tablet (25 mg total) by mouth every 6 (six) hours as needed. 09/25/23  Yes Elnor Savant A, DO  predniSONE (STERAPRED UNI-PAK 21 TAB) 10 MG (21) TBPK tablet Take by mouth daily. Take 6 tabs by mouth daily  for 2 days, then 5 tabs for 2 days, then 4 tabs for 2 days, then 3 tabs for 2 days, 2 tabs for 2 days, then 1 tab by mouth daily for 2 days 09/25/23  Yes Elnor Savant A, DO  azithromycin  (ZITHROMAX  Z-PAK) 250 MG tablet Take as directed, take 500 mg (2 tablets) today, then one (1) tablet a day for the next 4 days. 05/28/16   Bernard Drivers, MD  HYDROcodone -acetaminophen  (NORCO) 5-325 MG per tablet Take 1 tablet by mouth. Every 4-6 hours as needed for pain.      [provider]  HYDROcodone -acetaminophen  (NORCO) 5-325 MG per tablet Take 1 tablet by mouth every 6 (six) hours as needed. 09/23/13   Palumbo, April, MD  HYDROcodone -acetaminophen  (NORCO/VICODIN) 5-325 MG tablet Take 1 tablet by mouth every 6 (six) hours as needed for moderate pain. 02/09/19   Zackowski, Scott, MD  ibuprofen  (ADVIL ) 600 MG tablet Take 1 tablet (600 mg total) by mouth every 6 (six) hours as needed. 01/05/21   Kommor, Madison, MD  lidocaine  (LIDODERM ) 5 % Place 1 patch onto the skin daily. Remove & Discard patch within 12 hours or as directed by MD 06/08/18   Henderly, Britni A, PA-C  naproxen  (NAPROSYN ) 500 MG tablet Take 1 tablet (500 mg total) by mouth 2 (two) times daily. 06/08/18   Henderly, Britni A, PA-C  ondansetron  (ZOFRAN ) 8 MG tablet Take 8 mg by mouth. Every 4 -6 hours as needed.     [provider]  ondansetron  (ZOFRAN ) 8 MG tablet Take 1 tablet (8 mg total) by mouth every 8 (eight) hours as needed for nausea. 02/09/13   Palumbo, April, MD  Vitamin D, Ergocalciferol, (DRISDOL) 50000 units CAPS capsule Take 50,000 Units by mouth every 7 (seven) days.    [provider]  Past Surgical History Past Surgical History:  Procedure Laterality Date   APPENDECTOMY  03/30/1995   CESAREAN SECTION     ROTATOR CUFF REPAIR Right    Family History Family History  Problem Relation Age of Onset   Arthritis Mother    Stroke Mother    Hypertension Mother    Diabetes Mother    Arthritis Father    Cancer Sister        hysterectomy due to abnormal pap smears    Social History Social History   Tobacco Use   Smoking status: Never   Smokeless tobacco: Never  Vaping Use   Vaping status: Never Used  Substance Use Topics   Alcohol use: No   Drug use: No   Allergies Tuberculin tests  Review of Systems A thorough  review of systems was obtained and all systems are negative except as noted in the HPI and PMH.   Physical Exam Vital Signs  I have reviewed the triage vital signs BP 132/72 (BP Location: Right Arm)   Pulse (!) 59   Temp (!) 97.3 F (36.3 C)   Resp 18   Wt 82.6 kg   SpO2 96%   BMI 34.39 kg/m  Physical Exam Vitals and nursing note reviewed.  Constitutional:      General: She is not in acute distress.    Appearance: Normal appearance. She is well-developed. She is not ill-appearing.  HENT:     Head: Normocephalic and atraumatic. No contusion, right periorbital erythema, left periorbital erythema or laceration.     Jaw: There is normal jaw occlusion. No trismus or tenderness.      Right Ear: Tympanic membrane and external ear normal.     Left Ear: Tympanic membrane and external ear normal.     Nose: Nose normal.     Mouth/Throat:     Mouth: Mucous membranes are moist.   Eyes:     General: No scleral icterus.       Right eye: No discharge.        Left eye: No discharge.     Extraocular Movements: Extraocular movements intact.     Pupils: Pupils are equal, round, and reactive to light.    Cardiovascular:     Rate and Rhythm: Normal rate.  Pulmonary:     Effort: Pulmonary effort is normal. No respiratory distress.     Breath sounds: No stridor.  Abdominal:     General: Abdomen is flat. There is no distension.     Tenderness: There is no guarding.   Musculoskeletal:        General: No deformity.     Cervical back: Full passive range of motion without pain. No rigidity.   Skin:    General: Skin is warm and dry.     Coloration: Skin is not cyanotic, jaundiced or pale.   Neurological:     Mental Status: She is alert and oriented to person, place, and time.     GCS: GCS eye subscore is 4. GCS verbal subscore is 5. GCS motor subscore is 6.     Cranial Nerves: Cranial nerves 2-12 are intact. No dysarthria or facial asymmetry.     Sensory: Sensation is intact.     Motor:  Motor function is intact.     Coordination: Coordination is intact.     Gait: Gait is intact.   Psychiatric:        Speech: Speech normal.        Behavior: Behavior normal. Behavior is  cooperative.     ED Results and Treatments Labs (all labs ordered are listed, but only abnormal results are displayed) Labs Reviewed - No data to display                                                                                                                        Radiology No results found.  Pertinent labs & imaging results that were available during my care of the patient were reviewed by me and considered in my medical decision making (see MDM for details).  Medications Ordered in ED Medications - No data to display                                                                                                                                   Procedures Procedures  (including critical care time)  Medical Decision Making / ED Course    Medical Decision Making:    ADENA SIMA is a 68 y.o. female with past medical history as below, significant for arthritis, hypertension who presents to the ED with complaint of possible allergic reaction. The complaint involves an extensive differential diagnosis and also carries with it a high risk of complications and morbidity.  Serious etiology was considered. Ddx includes but is not limited to: Allergic reaction, irritant dermatitis, contact dermatitis, cellulitis etc.  Complete initial physical exam performed, notably the patient was in no distress, resting comfortably.    Reviewed and confirmed nursing documentation for past medical history, family history, social history.  Vital signs reviewed.     Brief summary:  [age]-year-old female history above here with possible allergic reaction to TB skin test Reaction appears to localize to her left forearm the location of the tuberculin test.  No surrounding erythema or cellulitic changes.   There is no induration.  She reports the area around the TB test is itchy. Doubt systemic reaction. She also has some itching to the left temporal area of her face.  No evidence of neurologic compromise, no evidence of any ocular involvement.  No rash of the face or vesicles.  No other maladies to the ears, no vision or hearing changes.     Will give antihistamine and some steroids, advised topical ointment for her left forearm.  Unclear etiology of the irritation of the left side of her face, does not appear to be consistent with acute life-threatening abnormality.  Possible irritant dermatitis  Strict return precautions advised, vies follow-up PCP later this week, avoid TB skin test in the future  Patient in no distress and overall condition is stable. Detailed discussions were had with the patient/guardian regarding current findings, and need for close f/u with PCP or on call doctor. The patient/guardian has been instructed to return immediately if the symptoms worsen in any way for re-evaluation. Patient/guardian verbalized understanding and is in agreement with current care plan. All questions answered prior to discharge.                Additional history obtained: -Additional history obtained from na -External records from outside source obtained and reviewed including: Chart review including previous notes, labs, imaging, consultation notes including  Recent ER visit, home medications   Lab Tests: na  EKG   EKG Interpretation Date/Time:    Ventricular Rate:    PR Interval:    QRS Duration:    QT Interval:    QTC Calculation:   R Axis:      Text Interpretation:           Imaging Studies ordered: na   Medicines ordered and prescription drug management: Meds ordered this encounter  Medications   predniSONE (STERAPRED UNI-PAK 21 TAB) 10 MG (21) TBPK tablet    Sig: Take by mouth daily. Take 6 tabs by mouth daily  for 2 days, then 5 tabs for 2 days, then 4  tabs for 2 days, then 3 tabs for 2 days, 2 tabs for 2 days, then 1 tab by mouth daily for 2 days    Dispense:  42 tablet    Refill:  0   diphenhydrAMINE (BENADRYL) 25 MG tablet    Sig: Take 1 tablet (25 mg total) by mouth every 6 (six) hours as needed.    Dispense:  30 tablet    Refill:  0    -I have reviewed the patients home medicines and have made adjustments as needed   Consultations Obtained: na   Cardiac Monitoring: Continuous pulse oximetry interpreted by myself, 100% on ra.    Social Determinants of Health:  Diagnosis or treatment significantly limited by social determinants of health: obesity   Reevaluation: After the interventions noted above, I reevaluated the patient and found that they have stayed the same  Co morbidities that complicate the patient evaluation  Past Medical History:  Diagnosis Date   Arthritis    Headache(784.0)    frequent   Hypertension       Dispostion: Disposition decision including need for hospitalization was considered, and patient discharged from emergency department.    Final Clinical Impression(s) / ED Diagnoses Final diagnoses:  Allergic reaction, initial encounter        Elnor Jayson LABOR, DO 09/25/23 0820
